# Patient Record
Sex: Female | Born: 1950 | Race: White | Hispanic: No | Marital: Married | Smoking: Former smoker
Health system: Southern US, Community
[De-identification: ages and names within clinical notes are randomized; demographics above are authoritative.]

## PROBLEM LIST (undated history)

## (undated) DIAGNOSIS — K219 Gastro-esophageal reflux disease without esophagitis: Secondary | ICD-10-CM

## (undated) DIAGNOSIS — M1990 Unspecified osteoarthritis, unspecified site: Secondary | ICD-10-CM

## (undated) HISTORY — PX: TONSILLECTOMY: SUR1361

---

## 2019-04-05 ENCOUNTER — Ambulatory Visit: Payer: Self-pay

## 2019-04-13 ENCOUNTER — Ambulatory Visit: Payer: Medicare Other | Attending: Internal Medicine

## 2019-04-13 DIAGNOSIS — Z23 Encounter for immunization: Secondary | ICD-10-CM | POA: Insufficient documentation

## 2019-04-13 NOTE — Progress Notes (Signed)
   Covid-19 Vaccination Clinic  Name:  Veronica Hodges    MRN: [id number] DOB: 19-Feb-1951  04/13/2019  [name] was observed post Covid-19 immunization for 15 minutes without incidence. She was provided with Vaccine Information Sheet and instruction to access the V-Safe system.   [name] was instructed to call 911 with any severe reactions post vaccine: Marland Kitchen Difficulty breathing  . Swelling of your face and throat  . A fast heartbeat  . A bad rash all over your body  . Dizziness and weakness    Immunizations Administered    Name Date Dose VIS Date Route   Pfizer COVID-19 Vaccine 04/13/2019  2:52 PM 0.3 mL 02/15/2019 Intramuscular   Manufacturer: [employer], [employer]   Lot: [id number]   NDC: [address]-9

## 2019-04-26 ENCOUNTER — Ambulatory Visit: Payer: Self-pay

## 2019-05-08 ENCOUNTER — Ambulatory Visit: Payer: Medicare Other | Attending: Internal Medicine

## 2019-05-08 DIAGNOSIS — Z23 Encounter for immunization: Secondary | ICD-10-CM

## 2019-05-08 NOTE — Progress Notes (Signed)
   Covid-19 Vaccination Clinic  Name:  Veronica Hodges    MRN: [id number] DOB: [birth year]  05/08/2019  [name] was observed post Covid-19 immunization for 15 minutes without incident. She was provided with Vaccine Information Sheet and instruction to access the V-Safe system.   [name] was instructed to call 911 with any severe reactions post vaccine: Marland Kitchen Difficulty breathing  . Swelling of face and throat  . A fast heartbeat  . A bad rash all over body  . Dizziness and weakness   Immunizations Administered    Name Date Dose VIS Date Route   Pfizer COVID-19 Vaccine 05/08/2019 11:07 AM 0.3 mL 02/15/2019 Intramuscular   Manufacturer: [employer], [employer]   Lot: [id number]   NDC: [address]-5

## 2019-12-21 ENCOUNTER — Other Ambulatory Visit: Payer: Self-pay

## 2019-12-21 ENCOUNTER — Ambulatory Visit: Payer: Medicare Other | Attending: Internal Medicine

## 2019-12-21 DIAGNOSIS — Z23 Encounter for immunization: Secondary | ICD-10-CM

## 2019-12-21 NOTE — Progress Notes (Signed)
   Covid-19 Vaccination Clinic  Name:  Veronica Hodges    MRN: [id number] DOB: 05-Sep-1950  12/21/2019  Veronica Hodges was observed post Covid-19 immunization for 15 minutes without incident. She was provided with Vaccine Information Sheet and instruction to access the V-Safe system.   Veronica Hodges was instructed to call 911 with any severe reactions post vaccine: Marland Kitchen Difficulty breathing  . Swelling of face and throat  . A fast heartbeat  . A bad rash all over body  . Dizziness and weakness

## 2020-03-03 ENCOUNTER — Other Ambulatory Visit: Payer: Self-pay

## 2020-03-03 ENCOUNTER — Emergency Department (HOSPITAL_COMMUNITY)
Admission: EM | Admit: 2020-03-03 | Discharge: 2020-03-04 | Disposition: A | Discharge status: Home / Self Care | Payer: Medicare Other | Source: Home / Self Care

## 2020-03-03 DIAGNOSIS — R002 Palpitations: Secondary | ICD-10-CM | POA: Insufficient documentation

## 2020-03-03 DIAGNOSIS — Z5321 Procedure and treatment not carried out due to patient leaving prior to being seen by health care provider: Secondary | ICD-10-CM | POA: Insufficient documentation

## 2020-03-03 DIAGNOSIS — R079 Chest pain, unspecified: Secondary | ICD-10-CM | POA: Diagnosis present

## 2020-03-03 DIAGNOSIS — I214 Non-ST elevation (NSTEMI) myocardial infarction: Secondary | ICD-10-CM | POA: Diagnosis not present

## 2020-03-03 DIAGNOSIS — I5181 Takotsubo syndrome: Secondary | ICD-10-CM | POA: Diagnosis not present

## 2020-03-03 DIAGNOSIS — Z23 Encounter for immunization: Secondary | ICD-10-CM | POA: Diagnosis not present

## 2020-03-03 DIAGNOSIS — I5042 Chronic combined systolic (congestive) and diastolic (congestive) heart failure: Secondary | ICD-10-CM | POA: Diagnosis not present

## 2020-03-03 DIAGNOSIS — Z20822 Contact with and (suspected) exposure to covid-19: Secondary | ICD-10-CM | POA: Diagnosis not present

## 2020-03-03 LAB — BASIC METABOLIC PANEL
Anion gap: 13 (ref 5–15)
BUN: 13 mg/dL (ref 8–23)
CO2: 24 mmol/L (ref 22–32)
Calcium: 9.8 mg/dL (ref 8.9–10.3)
Chloride: 101 mmol/L (ref 98–111)
Creatinine, Ser: 0.81 mg/dL (ref 0.44–1.00)
GFR, Estimated: >60 mL/min (ref >60)
Glucose, Bld: 133 mg/dL — ABNORMAL HIGH (ref 70–99)
Potassium: 4 mmol/L (ref 3.5–5.1)
Sodium: 138 mmol/L (ref 135–145)

## 2020-03-03 LAB — CBC
HCT: 38.8 % (ref 36.0–46.0)
Hemoglobin: 13.5 g/dL (ref 12.0–15.0)
MCH: 33.2 pg (ref 26.0–34.0)
MCHC: 34.8 g/dL (ref 30.0–36.0)
MCV: 95.3 fL (ref 80.0–100.0)
Platelets: 334 10*3/uL (ref 150–400)
RBC: 4.07 MIL/uL (ref 3.87–5.11)
RDW: 12 % (ref 11.5–15.5)
WBC: 9.6 10*3/uL (ref 4.0–10.5)
nRBC: 0 % (ref 0.0–0.2)

## 2020-03-03 LAB — TROPONIN I (HIGH SENSITIVITY)
Troponin I (High Sensitivity): 11 ng/L (ref <18)
Troponin I (High Sensitivity): 34 ng/L — ABNORMAL HIGH (ref <18)

## 2020-03-03 NOTE — ED Triage Notes (Signed)
Pt reports going for a walk this morning and after she got back inside, developed palpitations and chest pain radiating to both arms. Also sts that her hands were blue. Pain has diminished by arrival to ER.

## 2020-03-04 ENCOUNTER — Emergency Department (HOSPITAL_COMMUNITY): Payer: Medicare Other

## 2020-03-04 ENCOUNTER — Encounter (HOSPITAL_COMMUNITY): Payer: Self-pay | Admitting: Cardiology

## 2020-03-04 ENCOUNTER — Observation Stay (HOSPITAL_COMMUNITY)
Admission: EM | Admit: 2020-03-04 | Discharge: 2020-03-05 | Disposition: A | Discharge status: Home / Self Care | Payer: Medicare Other | Attending: Cardiology | Admitting: Cardiology

## 2020-03-04 DIAGNOSIS — I2510 Atherosclerotic heart disease of native coronary artery without angina pectoris: Secondary | ICD-10-CM

## 2020-03-04 DIAGNOSIS — I214 Non-ST elevation (NSTEMI) myocardial infarction: Secondary | ICD-10-CM

## 2020-03-04 DIAGNOSIS — R030 Elevated blood-pressure reading, without diagnosis of hypertension: Secondary | ICD-10-CM | POA: Diagnosis not present

## 2020-03-04 DIAGNOSIS — I5042 Chronic combined systolic (congestive) and diastolic (congestive) heart failure: Secondary | ICD-10-CM | POA: Insufficient documentation

## 2020-03-04 DIAGNOSIS — I200 Unstable angina: Secondary | ICD-10-CM

## 2020-03-04 DIAGNOSIS — E785 Hyperlipidemia, unspecified: Secondary | ICD-10-CM

## 2020-03-04 DIAGNOSIS — R079 Chest pain, unspecified: Secondary | ICD-10-CM

## 2020-03-04 DIAGNOSIS — Z20822 Contact with and (suspected) exposure to covid-19: Secondary | ICD-10-CM | POA: Insufficient documentation

## 2020-03-04 DIAGNOSIS — I5181 Takotsubo syndrome: Principal | ICD-10-CM | POA: Insufficient documentation

## 2020-03-04 DIAGNOSIS — I10 Essential (primary) hypertension: Secondary | ICD-10-CM

## 2020-03-04 DIAGNOSIS — Z23 Encounter for immunization: Secondary | ICD-10-CM | POA: Insufficient documentation

## 2020-03-04 HISTORY — DX: Gastro-esophageal reflux disease without esophagitis: K21.9

## 2020-03-04 HISTORY — DX: Unspecified osteoarthritis, unspecified site: M19.90

## 2020-03-04 LAB — COMPREHENSIVE METABOLIC PANEL
ALT: 20 U/L (ref 0–44)
AST: 24 U/L (ref 15–41)
Albumin: 3.8 g/dL (ref 3.5–5.0)
Alkaline Phosphatase: 42 U/L (ref 38–126)
Anion gap: 12 (ref 5–15)
BUN: 12 mg/dL (ref 8–23)
CO2: 22 mmol/L (ref 22–32)
Calcium: 9.3 mg/dL (ref 8.9–10.3)
Chloride: 103 mmol/L (ref 98–111)
Creatinine, Ser: 0.7 mg/dL (ref 0.44–1.00)
GFR, Estimated: >60 mL/min (ref >60)
Glucose, Bld: 97 mg/dL (ref 70–99)
Potassium: 3.7 mmol/L (ref 3.5–5.1)
Sodium: 137 mmol/L (ref 135–145)
Total Bilirubin: 1 mg/dL (ref 0.3–1.2)
Total Protein: 6.6 g/dL (ref 6.5–8.1)

## 2020-03-04 LAB — CBC WITH DIFFERENTIAL/PLATELET
Abs Immature Granulocytes: 0.03 10*3/uL (ref 0.00–0.07)
Basophils Absolute: 0 10*3/uL (ref 0.0–0.1)
Basophils Relative: 0 %
Eosinophils Absolute: 0.1 10*3/uL (ref 0.0–0.5)
Eosinophils Relative: 1 %
HCT: 36.8 % (ref 36.0–46.0)
Hemoglobin: 12 g/dL (ref 12.0–15.0)
Immature Granulocytes: 0 %
Lymphocytes Relative: 21 %
Lymphs Abs: 1.5 10*3/uL (ref 0.7–4.0)
MCH: 32.3 pg (ref 26.0–34.0)
MCHC: 32.6 g/dL (ref 30.0–36.0)
MCV: 98.9 fL (ref 80.0–100.0)
Monocytes Absolute: 0.5 10*3/uL (ref 0.1–1.0)
Monocytes Relative: 6 %
Neutro Abs: 5.3 10*3/uL (ref 1.7–7.7)
Neutrophils Relative %: 72 %
Platelets: 286 10*3/uL (ref 150–400)
RBC: 3.72 MIL/uL — ABNORMAL LOW (ref 3.87–5.11)
RDW: 12.3 % (ref 11.5–15.5)
WBC: 7.4 10*3/uL (ref 4.0–10.5)
nRBC: 0 % (ref 0.0–0.2)

## 2020-03-04 LAB — RESP PANEL BY RT-PCR (FLU A&B, COVID) ARPGX2
Influenza A by PCR: NEGATIVE
Influenza B by PCR: NEGATIVE
SARS Coronavirus 2 by RT PCR: NEGATIVE

## 2020-03-04 LAB — TROPONIN I (HIGH SENSITIVITY)
Troponin I (High Sensitivity): 34 ng/L — ABNORMAL HIGH (ref <18)
Troponin I (High Sensitivity): 24 ng/L — ABNORMAL HIGH (ref <18)
Troponin I (High Sensitivity): 21 ng/L — ABNORMAL HIGH (ref <18)

## 2020-03-04 MED ORDER — SODIUM CHLORIDE 0.9 % WEIGHT BASED INFUSION
3.0000 mL/kg/h | INTRAVENOUS | Status: DC
  Administered 2020-03-05: 04:00:00 3 mL/kg/h via INTRAVENOUS

## 2020-03-04 MED ORDER — ASPIRIN 81 MG PO CHEW
81.0000 mg | CHEWABLE_TABLET | ORAL | Status: AC
  Administered 2020-03-05: 04:00:00 81 mg via ORAL
  Filled 2020-03-04: qty 1

## 2020-03-04 MED ORDER — SODIUM CHLORIDE 0.9 % IV SOLN: 250.0000 mL | INTRAVENOUS | Status: DC | PRN

## 2020-03-04 MED ORDER — ONDANSETRON HCL 4 MG/2ML IJ SOLN: 4.0000 mg | Freq: Four times a day (QID) | INTRAMUSCULAR | Status: DC | PRN

## 2020-03-04 MED ORDER — HEPARIN (PORCINE) 25000 UT/250ML-% IV SOLN
800.0000 [IU]/h | INTRAVENOUS | Status: DC
  Administered 2020-03-04: 19:00:00 800 [IU]/h via INTRAVENOUS
  Filled 2020-03-04: qty 250

## 2020-03-04 MED ORDER — SODIUM CHLORIDE 0.9% FLUSH
3.0000 mL | Freq: Two times a day (BID) | INTRAVENOUS | Status: DC
  Administered 2020-03-04: 23:00:00 3 mL via INTRAVENOUS

## 2020-03-04 MED ORDER — SODIUM CHLORIDE 0.9% FLUSH: 3.0000 mL | INTRAVENOUS | Status: DC | PRN

## 2020-03-04 MED ORDER — ACETAMINOPHEN 325 MG PO TABS
650.0000 mg | ORAL_TABLET | ORAL | Status: DC | PRN
  Administered 2020-03-04 – 2020-03-05 (×2): 650 mg via ORAL
  Filled 2020-03-04 (×2): qty 2

## 2020-03-04 MED ORDER — SODIUM CHLORIDE 0.9 % WEIGHT BASED INFUSION: 1.0000 mL/kg/h | INTRAVENOUS | Status: DC

## 2020-03-04 MED ORDER — HEPARIN BOLUS VIA INFUSION
4000.0000 [IU] | Freq: Once | INTRAVENOUS | Status: AC
  Administered 2020-03-04: 19:00:00 4000 [IU] via INTRAVENOUS
  Filled 2020-03-04: qty 4000

## 2020-03-04 MED ORDER — ASPIRIN EC 81 MG PO TBEC: 81.0000 mg | DELAYED_RELEASE_TABLET | Freq: Every day | ORAL | Status: DC

## 2020-03-04 MED ORDER — ATORVASTATIN CALCIUM 80 MG PO TABS
80.0000 mg | ORAL_TABLET | Freq: Every day | ORAL | Status: DC
  Administered 2020-03-04: 80 mg via ORAL
  Filled 2020-03-04: qty 1

## 2020-03-04 MED ORDER — NITROGLYCERIN 0.4 MG SL SUBL: 0.4000 mg | SUBLINGUAL_TABLET | SUBLINGUAL | Status: DC | PRN

## 2020-03-04 MED ORDER — METOPROLOL TARTRATE 25 MG PO TABS
25.0000 mg | ORAL_TABLET | Freq: Two times a day (BID) | ORAL | Status: DC
  Administered 2020-03-04: 23:00:00 25 mg via ORAL
  Filled 2020-03-04: qty 1

## 2020-03-04 NOTE — ED Notes (Signed)
Pt did not want to wait and left th waiting room call family to come get her.

## 2020-03-04 NOTE — Progress Notes (Signed)
ANTICOAGULATION CONSULT NOTE  Pharmacy Consult for Heparin Indication: chest pain/ACS  No Known Allergies  Patient Measurements: Height: 5\' 5"  (165.1 cm) Weight: 66 kg (145 lb 8.1 oz) IBW/kg (Calculated) : 57 Heparin Dosing Weight: 66 kg  Vital Signs: Temp: 98.6 F (37 C) (12/29 1547) Temp Source: Oral (12/29 1547) BP: 150/64 (12/29 1705) Pulse Rate: 74 (12/29 1705)  Labs: Recent Labs    03/03/20 1627 03/03/20 2202 03/04/20 0216 03/04/20 1538  HGB 13.5  --   --  12.0  HCT 38.8  --   --  36.8  PLT 334  --   --  286  CREATININE 0.81  --   --   --   TROPONINIHS 11 34* 34*  --     Estimated Creatinine Clearance: 58.2 mL/min (by C-G formula based on SCr of 0.81 mg/dL).   Medical History: Past Medical History:  Diagnosis Date  . GERD (gastroesophageal reflux disease)     Medications:  Scheduled:  . heparin  4,000 Units Intravenous Once    Assessment: Patient is a 44 yof that presented to the ed with CP and was found to have slightly elevated trop levels yesterday. Patient had persistent cp today. Patient has no significant medical hx and cards plans for cath tomorrow. Pharmacy has been asked to dose heparin at this time.  Goal of Therapy:  Heparin level 0.3-0.7 units/ml Monitor platelets by anticoagulation protocol: Yes   Plan:  - Heparin bolus 4000 units IV x 1 dose - Heparin drip @ 800 units/hr - Heparin level in ~ 6 hours  - Monitor patient for s/s of bleeding and CBC while on heparin   66 PharmD. BCPS  03/04/2020,5:20 PM

## 2020-03-04 NOTE — H&P (Signed)
Cardiology Admission History and Physical:   Patient ID: Veronica Hodges MRN: [id number]; DOB: June 24, 1950   Admission date: 03/04/2020  Primary Care Provider: Oneita Hurt No [facility] Cardiologist: Jodelle Red, MD  [facility] Electrophysiologist:  None   Chief Complaint:  Chest pain  Patient Profile:   Veronica Hodges is a 69 y.o. female with no known PMH who presented to the ED with chest pain.    History of Present Illness:   Veronica Hodges is a 69 yo female with no known PMH. Reports she and her husband relocated here recently from [place]. She has not established with a PCP yet. Says she has never been dx with any significant medical problems in the past. No reported hx of coronary disease, but brother recently had a syncopal episode and wearing a cardiac monitor.   Says she is generally very active. [place] are lots of walking trails which they do on a regular basis. She has never had any chest pain with this activity.   Yesterday reports she walked with husband and generally felt ok. Came back to the house around 2pm and took a shower. Developed centralized chest pain/pressure with radiation into bilateral arms. Also noticed her fingers turned blue on her left hand. Presented to the ED with symptoms and had labs drawn that showed stable electrolytes, hsTn 11>>34>>34, WBC 9.6, Hgb 13.5. CXR with no acute edema, but 63mm right upper lobe nodule. She waited in the waiting room for 13hrs and eventually left. When she got home she set up her MyChart account and saw her labs. Called her insurance company and talked with NP who instructed she come back to the ED for evaluation. Called EMS.   In talking with the patient she reports having an episode of jaw tightness while in the waiting room. She thought this was related to being tense while waiting. Currently no chest pain at time of assessment.    Past Medical History:  Diagnosis Date  .  GERD (gastroesophageal reflux disease)     Medications Prior to Admission: Prior to Admission medications   Not on File     Allergies:   No Known Allergies  Social History:   Social History   Socioeconomic History  . Marital status: Married    Spouse name: Not on file  . Number of children: Not on file  . Years of education: Not on file  . Highest education level: Not on file  Occupational History  . Not on file  Tobacco Use  . Smoking status: Not on file  . Smokeless tobacco: Not on file  Substance and Sexual Activity  . Alcohol use: Not on file  . Drug use: Not on file  . Sexual activity: Not on file  Other Topics Concern  . Not on file  Social History Narrative  . Not on file   Social Determinants of Health   Financial Resource Strain: Not on file  Food Insecurity: Not on file  Transportation Needs: Not on file  Physical Activity: Not on file  Stress: Not on file  Social Connections: Not on file  Intimate Partner Violence: Not on file    Family History:   The patient's family history includes Hypertension in her father.    ROS:  Please see the history of present illness.  All other ROS reviewed and negative.     Physical Exam/Data:   Vitals:   03/04/20 1547 03/04/20 1558 03/04/20 1612 03/04/20 1705  BP:   Marland Kitchen)  141/55 (!) 150/64  Pulse:   71 74  Resp:   18 18  Temp: 98.6 F (37 C)     TempSrc: Oral     SpO2:   100% 98%  Weight:  66 kg    Height:  5\' 5"  (1.651 m)     No intake or output data in the 24 hours ending 03/04/20 1709 Last 3 Weights 03/04/2020 03/03/2020  Weight (lbs) 145 lb 8.1 oz 147 lb  Weight (kg) 66 kg 66.679 kg     Body mass index is 24.21 kg/m.  General:  Well nourished, well developed, in no acute distress HEENT: normal Lymph: no adenopathy Neck: no JVD Endocrine:  No thryomegaly Vascular: No carotid bruits; FA pulses 2+ bilaterally without bruits  Cardiac:  normal S1, S2; RRR; no murmur  Lungs:  clear to auscultation  bilaterally, no wheezing, rhonchi or rales  Abd: soft, nontender, no hepatomegaly  Ext: no edema Musculoskeletal:  No deformities, BUE and BLE strength normal and equal Skin: warm and dry  Neuro:  CNs 2-12 intact, no focal abnormalities noted Psych:  Normal affect    EKG:  The ECG that was done 12/29 was personally reviewed and demonstrates SR 73bpm, RBBB, nonspecific changes  Relevant CV Studies:  N/a  Laboratory Data:  High Sensitivity Troponin:   Recent Labs  Lab 03/03/20 1627 03/03/20 2202 03/04/20 0216  TROPONINIHS 11 34* 34*      Chemistry Recent Labs  Lab 03/03/20 1627  NA 138  K 4.0  CL 101  CO2 24  GLUCOSE 133*  BUN 13  CREATININE 0.81  CALCIUM 9.8  GFRNONAA >60  ANIONGAP 13    No results for input(s): PROT, ALBUMIN, AST, ALT, ALKPHOS, BILITOT in the last 168 hours. Hematology Recent Labs  Lab 03/03/20 1627 03/04/20 1538  WBC 9.6 7.4  RBC 4.07 3.72*  HGB 13.5 12.0  HCT 38.8 36.8  MCV 95.3 98.9  MCH 33.2 32.3  MCHC 34.8 32.6  RDW 12.0 12.3  PLT 334 286   BNPNo results for input(s): BNP, PROBNP in the last 168 hours.  DDimer No results for input(s): DDIMER in the last 168 hours.   Radiology/Studies:  DG Chest 1 View  Result Date: 03/04/2020 CLINICAL DATA:  Intermittent chest pain EXAM: CHEST  1 VIEW COMPARISON:  None. FINDINGS: No acute consolidation or effusion. Normal cardiac size. No pneumothorax. 17 mm irregular right upper lobe lung nodule. Summation artifact versus tiny nodule left lung apex. IMPRESSION: 1. No acute infiltrate or edema. 2. 17 mm irregular right upper lobe lung nodule. Tiny nodule versus summation artifact at left apex. CT chest recommended for further evaluation. Electronically Signed   By: 03/06/2020 M.D.   On: 03/04/2020 15:31     Assessment and Plan:   Veronica Hodges is a 69 y.o. female with no known PMH who presented to the ED with chest pain.    1. NSTEMI: presented with chest pain after walking with  her husband. hsTn 11>>34>>34. No acute ischemia noted on EKG. Her symptoms are concerning for coronary disease. Will need further work up with ischemic evaluation. Seems reasonable to proceed with cardiac catheterization tomorrow. hsTn pending -- admit  -- start IV heparin, ASA, statin, and BB -- check echo -- The patient understands that risks included but are not limited to stroke (1 in 1000), death (1 in 1000), kidney failure [usually temporary] (1 in 500), bleeding (1 in 200), allergic reaction [possibly serious] (1 in 200). -- check  lipids and Hgb A1c  2. HTN: blood pressures are elevated while in the ED. Will add metoprolol 25mg  BID  HEAR Score (for undifferentiated chest pain):  HEAR Score: 4   Severity of Illness: The appropriate patient status for this patient is OBSERVATION. Observation status is judged to be reasonable and necessary in order to provide the required intensity of service to ensure the patient's safety. The patient's presenting symptoms, physical exam findings, and initial radiographic and laboratory data in the context of their medical condition is felt to place them at decreased risk for further clinical deterioration. Furthermore, it is anticipated that the patient will be medically stable for discharge from the hospital within 2 midnights of admission. The following factors support the patient status of observation.   " The patient's presenting symptoms include chest pain. " The physical exam findings include stable exam. " The initial radiographic and laboratory data are elevated troponin.  For questions or updates, please contact [facility] Please consult [email or web address] for contact info under     Signed, , NP  03/04/2020 5:09 PM

## 2020-03-04 NOTE — ED Triage Notes (Addendum)
Pt BIB [facility] w/ complaints of chest pain that started at 2 p.m. yesterday. Pt was seen in triage yesterday but due to 13 hour wait patient left. When pt was home she continued to have intermittent chest pain centralized that radiated to bilateral jaw. Pt stated pain is a pressure. Pt noted that she was able to see after d/c that troponins were elevated, contacted her NP who called 911 on her behalf to be seen in the ER. Pt endorsed initial nausea, but no emesis. Pt denies SoB, diarrhea, and dizziness. VSS w/ EMS.

## 2020-03-04 NOTE — ED Provider Notes (Signed)
[facility]   CSN: [id number] Arrival date & time: 03/04/20  1453     History No chief complaint on file.   Veronica Hodges is a 69 y.o. female.  HPI 69 year old female with no significant medical history presents to the ER with 2 days of chest pain.  States that her pain started central at around 2 PM yesterday.  Describes it as squeezing.  States that pain did travel to her left arm, and states that her left hand went blue for several minutes.  This however resolved.  The pain has intermittently also traveled down her right arm and also into her neck.  Denies any nausea or vomiting.  No diaphoresis.  Pain has waxed and waned.  She arrived at the ER yesterday, had some basic labs drawn, waited for 13 hours in the month home.  She initially did not have access to MyChart, however when she was able to login, she saw that her troponins were elevated.  She did not know what this meant, and called a nurse practitioner that was associated with her health insurance.  She was told to come back to the ER.  She states that she went to bed yesterday with very mild pain.  However throughout the day, the pain will fluctuate to as high as a 6 or 7 out of 10.  Sometimes will have some jaw pain.  Denies any dizziness, diarrhea, no back pain, no syncope.  Does endorse mild fluctuating nausea.  She was given some nitroglycerin here in the ER which did significantly improve her symptoms.  Currently her pain is 2/10.  He was also given some aspirin by her family.  She does not have a known cardiac history and is not on any medications, but however states that she has not seen a doctor in "many years".  She stopped smoking 25 years ago, no known personal history of cardiac disease, diabetes, high cholesterol, does state that her brother had cardiac disease 70 years old. HPI: A 69 year old patient presents for evaluation of chest pain. Initial onset of pain was  more than 6 hours ago. The patient's chest pain is described as heaviness/pressure/tightness and is not worse with exertion. The patient's chest pain is middle- or left-sided, is not well-localized, is not sharp and does not radiate to the arms/jaw/neck. The patient does not complain of nausea and denies diaphoresis. The patient has no history of stroke, has no history of peripheral artery disease, has not smoked in the past 90 days, denies any history of treated diabetes, has no relevant family history of coronary artery disease (first degree relative at less than age 70), is not hypertensive, has no history of hypercholesterolemia and does not have an elevated BMI (>=30).   Past Medical History:  Diagnosis Date  . GERD (gastroesophageal reflux disease)     Patient Active Problem List   Diagnosis Date Noted  . Non-STEMI (non-ST elevated myocardial infarction) (HCC) 03/04/2020    The histories are not reviewed yet. Please review them in the "History" navigator section and refresh this SmartLink.   OB History   No obstetric history on file.     Family History  Problem Relation Age of Onset  . Hypertension Father        Home Medications Prior to Admission medications   Not on File    Allergies    Patient has no known allergies.  Review of Systems   Review of Systems  Constitutional:  Negative for chills and fever.  HENT: Negative for ear pain and sore throat.   Eyes: Negative for pain and visual disturbance.  Respiratory: Negative for cough and shortness of breath.   Cardiovascular: Positive for chest pain and palpitations.  Gastrointestinal: Positive for nausea. Negative for abdominal pain and vomiting.  Genitourinary: Negative for dysuria and hematuria.  Musculoskeletal: Negative for arthralgias and back pain.  Skin: Negative for color change and rash.  Neurological: Negative for seizures and syncope.  All other systems reviewed and are negative.   Physical  Exam Updated Vital Signs BP (!) 150/64 (BP Location: Right Arm)   Pulse 74   Temp 98.6 F (37 C) (Oral)   Resp 18   Ht 5\' 5"  (1.651 m)   Wt 66 kg   SpO2 98%   BMI 24.21 kg/m   Physical Exam Vitals and nursing note reviewed.  Constitutional:      General: She is not in acute distress.    Appearance: She is well-developed and well-nourished. She is not ill-appearing, toxic-appearing or diaphoretic.  HENT:     Head: Normocephalic and atraumatic.  Eyes:     Conjunctiva/sclera: Conjunctivae normal.  Cardiovascular:     Rate and Rhythm: Normal rate and regular rhythm.     Pulses: Normal pulses.     Heart sounds: Normal heart sounds. No murmur heard.   Pulmonary:     Effort: Pulmonary effort is normal. No respiratory distress.     Breath sounds: Normal breath sounds.  Abdominal:     General: Abdomen is flat.     Palpations: Abdomen is soft.     Tenderness: There is no abdominal tenderness.  Musculoskeletal:        General: No edema.     Cervical back: Neck supple.     Right lower leg: No edema.     Left lower leg: No edema.  Skin:    General: Skin is warm and dry.     Findings: No erythema.  Neurological:     General: No focal deficit present.     Mental Status: She is alert and oriented to person, place, and time.  Psychiatric:        Mood and Affect: Mood and affect and mood normal.        Behavior: Behavior normal.     ED Results / Procedures / Treatments   Labs (all labs ordered are listed, but only abnormal results are displayed) Labs Reviewed  CBC WITH DIFFERENTIAL/PLATELET - Abnormal; Notable for the following components:      Result Value   RBC 3.72 (*)    All other components within normal limits  RESP PANEL BY RT-PCR (FLU A&B, COVID) ARPGX2  SARS CORONAVIRUS 2 (TAT 6-24 HRS)  COMPREHENSIVE METABOLIC PANEL  HEPARIN LEVEL (UNFRACTIONATED)  CBC  HEMOGLOBIN A1C  BASIC METABOLIC PANEL  LIPID PANEL  TROPONIN I (HIGH SENSITIVITY)  TROPONIN I (HIGH  SENSITIVITY)    EKG None  Radiology DG Chest 1 View  Result Date: 03/04/2020 CLINICAL DATA:  Intermittent chest pain EXAM: CHEST  1 VIEW COMPARISON:  None. FINDINGS: No acute consolidation or effusion. Normal cardiac size. No pneumothorax. 17 mm irregular right upper lobe lung nodule. Summation artifact versus tiny nodule left lung apex. IMPRESSION: 1. No acute infiltrate or edema. 2. 17 mm irregular right upper lobe lung nodule. Tiny nodule versus summation artifact at left apex. CT chest recommended for further evaluation. Electronically Signed   By: 03/06/2020 M.D.   On: 03/04/2020  15:31    Procedures .Critical Care Performed by: Mare Ferrari, PA-C Authorized by: Mare Ferrari, PA-C   Critical care provider statement:    Critical care time (minutes):  30   Critical care was necessary to treat or prevent imminent or life-threatening deterioration of the following conditions:  Cardiac failure   Critical care was time spent personally by me on the following activities:  Discussions with consultants, evaluation of patient's response to treatment, examination of patient, ordering and performing treatments and interventions, ordering and review of laboratory studies, ordering and review of radiographic studies, pulse oximetry, re-evaluation of patient's condition, obtaining history from patient or surrogate and review of old charts   (including critical care time)  Medications Ordered in ED Medications  aspirin EC tablet 81 mg (has no administration in time range)  nitroGLYCERIN (NITROSTAT) SL tablet 0.4 mg (has no administration in time range)  acetaminophen (TYLENOL) tablet 650 mg (650 mg Oral Given 03/04/20 1801)  ondansetron (ZOFRAN) injection 4 mg (has no administration in time range)  metoprolol tartrate (LOPRESSOR) tablet 25 mg (has no administration in time range)  atorvastatin (LIPITOR) tablet 80 mg (has no administration in time range)  sodium chloride flush (NS) 0.9 %  injection 3 mL (has no administration in time range)  sodium chloride flush (NS) 0.9 % injection 3 mL (has no administration in time range)  0.9 %  sodium chloride infusion (has no administration in time range)  aspirin chewable tablet 81 mg (has no administration in time range)  0.9% sodium chloride infusion (has no administration in time range)    Followed by  0.9% sodium chloride infusion (has no administration in time range)  heparin bolus via infusion 4,000 Units (has no administration in time range)  heparin ADULT infusion 100 units/mL (25000 units/288mL) (has no administration in time range)    ED Course  I have reviewed the triage vital signs and the nursing notes.  Pertinent labs & imaging results that were available during my care of the patient were reviewed by me and considered in my medical decision making (see chart for details).  Clinical Course as of 03/04/20 [place]  Wed Mar 04, 2020  3710 69 year old female here with chest pain, jaw pain, shoulder pain beginning yesterday, intermittent for past 2 days, currently 2/10 intensity, never had this before.  Reports subjective fevers this week possibly.  Denies SOB.  Did receive covid x 2 vaccines and a booster.  No prior cardiac hx, hasn't seen any doctor in many years, unaware of any hx of HLD, HTN, Diabetes.  Quit smoking 25 years ago.  Family hx sig only for brother w/ cardiac disease over age 25. Yesterday she came to the ED and left after long waiting times without being seen, had experienced relief with nitro given by EMS prior to arrival.  Trop 11 -> 34 on repeat yest.  Now with stable vitals, pending labs and ecg [MT]  1604 Cards consulted [MT]  1604 Ecg per my interpretation with RBBB, no STEMI. [MT]    Clinical Course User Index [MT] Trifan, Kermit Balo, MD   MDM Rules/Calculators/A&P HEAR Score: 3                        69 year old female with complaints of 2 days of chest pain Vitals on arrival overall reassuring.   Physical exam unremarkable.  Overall well-appearing.  I reviewed her lab work from when she arrived yesterday.  Troponin went  from 11-34, repeat troponin today at 2 PM remained stable at 34.  EKG reviewed by myself and my supervising physician, consistent with right bundle branch block, no STEMI.  Lab work is pending, given concerning story for NSTEMI, however cardiology was consulted.  CBC is largely unremarkable.  Covid is negative.  Chest x-ray with incidental right pulmonary nodule.  She will need further evaluation for this.  Cardiology saw and evaluated the patient, plan for admission and cath tomorrow.  Started on heparin drip.  Remains hemodynamically stable here in the ED.  This was a shared visit with my supervising physician Dr. Renaye Rakers who independently saw and evaluated the patient & provided guidance in evaluation/management/disposition ,in agreement with care   Final Clinical Impression(s) / ED Diagnoses Final diagnoses:  NSTEMI (non-ST elevated myocardial infarction) [facility])    Rx / DC Orders ED Discharge Orders    None       Leone Brand 03/04/20 1848    Terald Sleeper, MD 03/04/20 2016

## 2020-03-04 NOTE — ED Notes (Signed)
Consent signed and at bedside  

## 2020-03-04 NOTE — ED Notes (Signed)
Pt taken to xray 

## 2020-03-04 NOTE — ED Notes (Signed)
Dinner Tray Ordered @ [phone].

## 2020-03-05 ENCOUNTER — Observation Stay (HOSPITAL_BASED_OUTPATIENT_CLINIC_OR_DEPARTMENT_OTHER): Payer: Medicare Other

## 2020-03-05 ENCOUNTER — Encounter (HOSPITAL_COMMUNITY)
Admission: EM | Disposition: A | Discharge status: Home / Self Care | Payer: Self-pay | Source: Home / Self Care | Attending: Emergency Medicine

## 2020-03-05 ENCOUNTER — Encounter (HOSPITAL_COMMUNITY): Payer: Self-pay | Admitting: Cardiology

## 2020-03-05 DIAGNOSIS — R778 Other specified abnormalities of plasma proteins: Secondary | ICD-10-CM

## 2020-03-05 DIAGNOSIS — I5181 Takotsubo syndrome: Secondary | ICD-10-CM

## 2020-03-05 DIAGNOSIS — I200 Unstable angina: Secondary | ICD-10-CM

## 2020-03-05 DIAGNOSIS — R079 Chest pain, unspecified: Secondary | ICD-10-CM

## 2020-03-05 DIAGNOSIS — I214 Non-ST elevation (NSTEMI) myocardial infarction: Secondary | ICD-10-CM | POA: Diagnosis not present

## 2020-03-05 DIAGNOSIS — E785 Hyperlipidemia, unspecified: Secondary | ICD-10-CM

## 2020-03-05 DIAGNOSIS — I2510 Atherosclerotic heart disease of native coronary artery without angina pectoris: Secondary | ICD-10-CM

## 2020-03-05 DIAGNOSIS — I10 Essential (primary) hypertension: Secondary | ICD-10-CM

## 2020-03-05 DIAGNOSIS — I5042 Chronic combined systolic (congestive) and diastolic (congestive) heart failure: Secondary | ICD-10-CM

## 2020-03-05 HISTORY — PX: LEFT HEART CATH AND CORONARY ANGIOGRAPHY: CATH118249

## 2020-03-05 LAB — CBC
HCT: 33.6 % — ABNORMAL LOW (ref 36.0–46.0)
Hemoglobin: 11.8 g/dL — ABNORMAL LOW (ref 12.0–15.0)
MCH: 32.9 pg (ref 26.0–34.0)
MCHC: 35.1 g/dL (ref 30.0–36.0)
MCV: 93.6 fL (ref 80.0–100.0)
Platelets: 304 10*3/uL (ref 150–400)
RBC: 3.59 MIL/uL — ABNORMAL LOW (ref 3.87–5.11)
RDW: 12.1 % (ref 11.5–15.5)
WBC: 8.8 10*3/uL (ref 4.0–10.5)
nRBC: 0 % (ref 0.0–0.2)

## 2020-03-05 LAB — ECHOCARDIOGRAM COMPLETE
Area-P 1/2: 2.32 cm2
Calc EF: 30.3 %
Height: 65 in
S' Lateral: 4 cm
Single Plane A2C EF: 36.1 %
Single Plane A4C EF: 32.2 %
Weight: 2240 oz

## 2020-03-05 LAB — BASIC METABOLIC PANEL
Anion gap: 8 (ref 5–15)
BUN: 12 mg/dL (ref 8–23)
CO2: 24 mmol/L (ref 22–32)
Calcium: 9.1 mg/dL (ref 8.9–10.3)
Chloride: 104 mmol/L (ref 98–111)
Creatinine, Ser: 0.82 mg/dL (ref 0.44–1.00)
GFR, Estimated: >60 mL/min (ref >60)
Glucose, Bld: 148 mg/dL — ABNORMAL HIGH (ref 70–99)
Potassium: 3.3 mmol/L — ABNORMAL LOW (ref 3.5–5.1)
Sodium: 136 mmol/L (ref 135–145)

## 2020-03-05 LAB — HEMOGLOBIN A1C
Hgb A1c MFr Bld: 5.2 % (ref 4.8–5.6)
Mean Plasma Glucose: 102.54 mg/dL

## 2020-03-05 LAB — LIPID PANEL
Cholesterol: 189 mg/dL (ref 0–200)
HDL: 71 mg/dL (ref >40)
LDL Cholesterol: 110 mg/dL — ABNORMAL HIGH (ref 0–99)
Total CHOL/HDL Ratio: 2.7 RATIO
Triglycerides: 42 mg/dL (ref <150)
VLDL: 8 mg/dL (ref 0–40)

## 2020-03-05 LAB — HEPARIN LEVEL (UNFRACTIONATED): Heparin Unfractionated: 0.31 IU/mL (ref 0.30–0.70)

## 2020-03-05 SURGERY — LEFT HEART CATH AND CORONARY ANGIOGRAPHY
Anesthesia: LOCAL

## 2020-03-05 MED ORDER — IOHEXOL 350 MG/ML SOLN
INTRAVENOUS | Status: DC | PRN
  Administered 2020-03-05: 10:00:00 90 mL via INTRA_ARTERIAL

## 2020-03-05 MED ORDER — INFLUENZA VAC A&B SA ADJ QUAD 0.5 ML IM PRSY
0.5000 mL | PREFILLED_SYRINGE | Freq: Once | INTRAMUSCULAR | Status: AC
  Administered 2020-03-05: 18:00:00 0.5 mL via INTRAMUSCULAR
  Filled 2020-03-05: qty 0.5

## 2020-03-05 MED ORDER — LIDOCAINE HCL (PF) 1 % IJ SOLN
INTRAMUSCULAR | Status: DC | PRN
  Administered 2020-03-05: 2 mL

## 2020-03-05 MED ORDER — SODIUM CHLORIDE 0.9 % IV SOLN: INTRAVENOUS | Status: AC

## 2020-03-05 MED ORDER — HEPARIN SODIUM (PORCINE) 1000 UNIT/ML IJ SOLN
INTRAMUSCULAR | Status: DC | PRN
  Administered 2020-03-05: 2500 [IU] via INTRAVENOUS

## 2020-03-05 MED ORDER — ACETAMINOPHEN 325 MG PO TABS: 650.0000 mg | ORAL_TABLET | ORAL | Status: DC | PRN

## 2020-03-05 MED ORDER — HEPARIN SODIUM (PORCINE) 1000 UNIT/ML IJ SOLN
INTRAMUSCULAR | Status: AC
  Filled 2020-03-05: qty 1

## 2020-03-05 MED ORDER — ASPIRIN 81 MG PO CHEW: 81.0000 mg | CHEWABLE_TABLET | Freq: Every day | ORAL | Status: DC

## 2020-03-05 MED ORDER — PERFLUTREN LIPID MICROSPHERE
1.0000 mL | INTRAVENOUS | Status: AC | PRN
  Administered 2020-03-05: 11:00:00 2 mL via INTRAVENOUS
  Filled 2020-03-05: qty 10

## 2020-03-05 MED ORDER — SODIUM CHLORIDE 0.9 % IV SOLN: 250.0000 mL | INTRAVENOUS | Status: DC | PRN

## 2020-03-05 MED ORDER — ONDANSETRON HCL 4 MG/2ML IJ SOLN: 4.0000 mg | Freq: Four times a day (QID) | INTRAMUSCULAR | Status: DC | PRN

## 2020-03-05 MED ORDER — FENTANYL CITRATE (PF) 100 MCG/2ML IJ SOLN
INTRAMUSCULAR | Status: DC | PRN
  Administered 2020-03-05 (×2): 25 ug via INTRAVENOUS

## 2020-03-05 MED ORDER — PNEUMOCOCCAL VAC POLYVALENT 25 MCG/0.5ML IJ INJ
0.5000 mL | INJECTION | Freq: Once | INTRAMUSCULAR | Status: AC
  Administered 2020-03-05: 18:00:00 0.5 mL via INTRAMUSCULAR
  Filled 2020-03-05: qty 0.5

## 2020-03-05 MED ORDER — INFLUENZA VAC A&B SA ADJ QUAD 0.5 ML IM PRSY: 0.5000 mL | PREFILLED_SYRINGE | INTRAMUSCULAR | Status: DC

## 2020-03-05 MED ORDER — SODIUM CHLORIDE 0.9% FLUSH: 3.0000 mL | INTRAVENOUS | Status: DC | PRN

## 2020-03-05 MED ORDER — METOPROLOL SUCCINATE ER 50 MG PO TB24
50.0000 mg | ORAL_TABLET | Freq: Every day | ORAL | Status: DC
  Administered 2020-03-05: 18:00:00 50 mg via ORAL
  Filled 2020-03-05: qty 1

## 2020-03-05 MED ORDER — POTASSIUM CHLORIDE CRYS ER 20 MEQ PO TBCR
20.0000 meq | EXTENDED_RELEASE_TABLET | Freq: Once | ORAL | Status: AC
  Administered 2020-03-05: 09:00:00 20 meq via ORAL
  Filled 2020-03-05: qty 1

## 2020-03-05 MED ORDER — LABETALOL HCL 5 MG/ML IV SOLN: 10.0000 mg | INTRAVENOUS | Status: AC | PRN

## 2020-03-05 MED ORDER — LIDOCAINE HCL (PF) 1 % IJ SOLN
INTRAMUSCULAR | Status: AC
  Filled 2020-03-05: qty 30

## 2020-03-05 MED ORDER — VERAPAMIL HCL 2.5 MG/ML IV SOLN
INTRAVENOUS | Status: AC
  Filled 2020-03-05: qty 2

## 2020-03-05 MED ORDER — PNEUMOCOCCAL VAC POLYVALENT 25 MCG/0.5ML IJ INJ: 0.5000 mL | INJECTION | INTRAMUSCULAR | Status: DC

## 2020-03-05 MED ORDER — HEPARIN (PORCINE) IN NACL 1000-0.9 UT/500ML-% IV SOLN
INTRAVENOUS | Status: AC
  Filled 2020-03-05: qty 1000

## 2020-03-05 MED ORDER — FENTANYL CITRATE (PF) 100 MCG/2ML IJ SOLN
INTRAMUSCULAR | Status: AC
  Filled 2020-03-05: qty 2

## 2020-03-05 MED ORDER — METOPROLOL SUCCINATE ER 50 MG PO TB24
50.0000 mg | ORAL_TABLET | Freq: Every day | ORAL | 3 refills | Status: DC
Start: 2020-03-05 — End: 2020-11-02

## 2020-03-05 MED ORDER — SODIUM CHLORIDE 0.9% FLUSH: 3.0000 mL | Freq: Two times a day (BID) | INTRAVENOUS | Status: DC

## 2020-03-05 MED ORDER — HYDRALAZINE HCL 20 MG/ML IJ SOLN: 10.0000 mg | INTRAMUSCULAR | Status: AC | PRN

## 2020-03-05 MED ORDER — MIDAZOLAM HCL 2 MG/2ML IJ SOLN
INTRAMUSCULAR | Status: DC | PRN
  Administered 2020-03-05 (×2): 0.5 mg via INTRAVENOUS

## 2020-03-05 MED ORDER — VERAPAMIL HCL 2.5 MG/ML IV SOLN
INTRAVENOUS | Status: DC | PRN
  Administered 2020-03-05: 10:00:00 10 mL via INTRA_ARTERIAL

## 2020-03-05 MED ORDER — MIDAZOLAM HCL 2 MG/2ML IJ SOLN
INTRAMUSCULAR | Status: AC
  Filled 2020-03-05: qty 2

## 2020-03-05 MED ORDER — HEPARIN (PORCINE) IN NACL 1000-0.9 UT/500ML-% IV SOLN
INTRAVENOUS | Status: DC | PRN
  Administered 2020-03-05 (×3): 500 mL

## 2020-03-05 MED ORDER — HEPARIN SODIUM (PORCINE) 5000 UNIT/ML IJ SOLN: 5000.0000 [IU] | Freq: Three times a day (TID) | INTRAMUSCULAR | Status: DC

## 2020-03-05 SURGICAL SUPPLY — 11 items
CATH INFINITI 5 FR JL3.5 (CATHETERS) ×2 IMPLANT
CATH INFINITI JR4 5F (CATHETERS) ×2 IMPLANT
DEVICE RAD COMP TR BAND LRG (VASCULAR PRODUCTS) ×2 IMPLANT
GLIDESHEATH SLEND A-KIT 6F 22G (SHEATH) ×2 IMPLANT
GUIDEWIRE INQWIRE 1.5J.035X260 (WIRE) ×1 IMPLANT
INQWIRE 1.5J .035X260CM (WIRE) ×2
KIT HEART LEFT (KITS) ×2 IMPLANT
PACK CARDIAC CATHETERIZATION (CUSTOM PROCEDURE TRAY) ×2 IMPLANT
SHEATH PROBE COVER 6X72 (BAG) ×2 IMPLANT
TRANSDUCER W/STOPCOCK (MISCELLANEOUS) ×2 IMPLANT
TUBING CIL FLEX 10 FLL-RA (TUBING) ×2 IMPLANT

## 2020-03-05 NOTE — CV Procedure (Addendum)
   Right dominant coronary anatomy  Tortuous left anterior descending with mid and distal acute angulation.  No obstructive disease.  Circumflex is normal  Left main is normal  RCA is normal and tortuous.  Anteroapical apical and inferoapical hypokinesis.  Basal hyperkinesis.  LVEDP is normal.  EF is 50%.   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  MINOCA / INOCA seems unlikely if wall motion abnormality is demonstrated.  Recommendation: Conservative medical management

## 2020-03-05 NOTE — Interval H&P Note (Signed)
Cath Lab Visit (complete for each Cath Lab visit)  Clinical Evaluation Leading to the Procedure:   ACS: Yes  Non-ACS:    Anginal Classification: CCS II  Anti-ischemic medical therapy: Minimal Therapy (1 class of medications)  Non-Invasive Test Results: No non-invasive testing performed  Prior CABG: No previous CABG      History and Physical Interval Note:  03/05/2020 9:11 AM  Veronica Hodges  has presented today for surgery, with the diagnosis of nstemi.  The various methods of treatment have been discussed with the patient and family. After consideration of risks, benefits and other options for treatment, the patient has consented to  Procedure(s): LEFT HEART CATH AND CORONARY ANGIOGRAPHY (N/A) as a surgical intervention.  The patient's history has been reviewed, patient examined, no change in status, stable for surgery.  I have reviewed the patient's chart and labs.  Questions were answered to the patient's satisfaction.     Lyn Records III

## 2020-03-05 NOTE — Discharge Summary (Addendum)
Discharge Summary    Patient ID: Veronica Hodges MRN: [id number]; DOB: 09-12-1950  Admit date: 03/04/2020 Discharge date: 03/05/2020  Primary Care Provider: Pcp, No  Primary Cardiologist: Jodelle Red, MD  Primary Electrophysiologist:  None   Discharge Diagnoses    Principal Problem:   Takotsubo cardiomyopathy Active Problems:   Non-STEMI (non-ST elevated myocardial infarction) [facility])   Unstable angina pectoris (HCC)   Hypertension   Chronic combined systolic and diastolic heart failure (HCC)   Dyslipidemia   Diagnostic Studies/Procedures    Cardiac cath 03/05/20 Conclusion   Right dominant coronary anatomy  Widely patent coronary arteries.    Coronary tortuosity is noted in all 3 territories.  The LAD contains 2 acute vessel angulation/kinks in the mid and distal territory..  No obstructive disease is noted.  Poor quality left ventriculogram demonstrates anteroapical and inferoapical hypokinesis.  Anterobasal inferobasal contractility is hyperdynamic.  LVEDP is 13 mmHg.   Clinical presentation and cath data suggests probable Takotsubo syndrome.  INOCA / MINOCA seem less likely.  RECOMMENDATIONS:   Conservative management  Coated aspirin 81 mg daily    Echo 03/05/20 1. Left ventricular ejection fraction, by estimation, is 30 to 35%. The  left ventricle has moderately decreased function. The left ventricle  demonstrates global hypokinesis. Left ventricular diastolic parameters are  consistent with Grade I diastolic  dysfunction (impaired relaxation).  2. Right ventricular systolic function is normal. The right ventricular  size is normal.  3. The mitral valve is normal in structure. Mild mitral valve  regurgitation. No evidence of mitral stenosis.  4. The aortic valve is normal in structure. Aortic valve regurgitation is  not visualized. No aortic stenosis is present.  5. The inferior vena cava is normal in size with greater than 50%   respiratory variability, suggesting right atrial pressure of 3 mmHg.  _____________   History of Present Illness     Veronica Hodges is a 69 y.o. female with no known PMH who presented to the ED with chest pain. Her and her husband recently located here from [place] and has not established with PCP yet. She denied history of CAD. Patient generally very active and denied chest pain on exertion. The day before presentation she had just gotten back from a walk and developed centralized chest pain/pressure with radiation into bilateral arms. She went to the ED for further evaluation  Hospital Course     Consultants: None  In the ED HS troponin was minimally elevated 11>34>34. CXR showed no edema but a right upper lobe nodule. She waited in the ER for hours and eventually left. When she got home she looked at her labs and talked with her NP who instructed her to go back to the ED for evaluation. EMS was called. Once again in the ED the patient was started on IV heparin and admitted for suspected NSTEMI. Also started on ASA, statin, and BB. She was taken back to the cath lab which showed widely patent coronaries with no obstructive disease, LV gram showed anteroapical and inferoapical hypokinesis, LVEDP . Presentation was suspected Takotsubo syndrome rather than NSTEMI. Recommended conservative management with Aspirin 81 mg daily. Echo showed LVEF 30-35%, global HK, G1DD, mild MR. The patient remained stable. Labs came back showing LDL 110 and A1C 5.2.  Will plan to send patient home on metoprolol succinate 50mg  daily. At follow-up will consider GDMT with ACE/ARB/ARNI. Patient not agreeable to starting a statin. The patient was instructed to take her blood pressures daily as well as  daily weights and low salt diet.   The patient was evaluated by Dr. Cristal Deer 03/05/20 and felt to be stable for discharge.   Did the patient have an acute coronary syndrome (MI, NSTEMI, STEMI, etc) this  admission?:  Yes                               AHA/ACC Clinical Performance & Quality Measures: 3. Aspirin prescribed? - Yes 4. ADP Receptor Inhibitor (Plavix/Clopidogrel, Brilinta/Ticagrelor or Effient/Prasugrel) prescribed (includes medically managed patients)? - No, mod CAD and no PCI 5. Beta Blocker prescribed? - Yes 6. High Intensity Statin (Lipitor 40-80mg  or Crestor 20-40mg ) prescribed? - Yes 7. EF assessed during THIS hospitalization? - Yes 8. For EF <40%, was ACEI/ARB prescribed? - No- will address at follow-up 9. For EF <40%, Aldosterone Antagonist (Spironolactone or Eplerenone) prescribed? - No - Reason:  will address at follow-up 10. Cardiac Rehab Phase II ordered (including medically managed patients)? - Yes       _____________  Discharge Vitals Blood pressure (!) 136/57, pulse 63, temperature 98.5 F (36.9 C), temperature source Oral, resp. rate 18, height  (1.651 m), weight 63.5 kg, SpO2 97 %.  Filed Weights   03/04/20 1558 03/04/20 2300 03/05/20 0410  Weight: 66 kg 63.9 kg 63.5 kg    Labs & Radiologic Studies    CBC Recent Labs    03/04/20 1538 03/05/20 0025  WBC 7.4 8.8  NEUTROABS 5.3  --   HGB 12.0 11.8*  HCT 36.8 33.6*  MCV 98.9 93.6  PLT 286 304   Basic Metabolic Panel Recent Labs    34/74/25 1650 03/05/20 0025  NA 137 136  K 3.7 3.3*  CL 103 104  CO2 22 24  GLUCOSE 97 148*  BUN 12 12  CREATININE 0.70 0.82  CALCIUM 9.3 9.1   Liver Function Tests Recent Labs    03/04/20 1650  AST 24  ALT 20  ALKPHOS 42  BILITOT 1.0  PROT 6.6  ALBUMIN 3.8   No results for input(s): LIPASE, AMYLASE in the last 72 hours. High Sensitivity Troponin:   Recent Labs  Lab 03/03/20 1627 03/03/20 2202 03/04/20 0216 03/04/20 1650 03/04/20 2259  TROPONINIHS 11 34* 34* 24* 21*    BNP Invalid input(s): POCBNP D-Dimer No results for input(s): DDIMER in the last 72 hours. Hemoglobin A1C Recent Labs    03/05/20 0025  HGBA1C 5.2   Fasting Lipid  Panel Recent Labs    03/05/20 0025  CHOL 189  HDL 71  LDLCALC 110*  TRIG 42  CHOLHDL 2.7   Thyroid Function Tests No results for input(s): TSH, T4TOTAL, T3FREE, THYROIDAB in the last 72 hours.  Invalid input(s): FREET3 _____________  DG Chest 1 View  Result Date: 03/04/2020 CLINICAL DATA:  Intermittent chest pain EXAM: CHEST  1 VIEW COMPARISON:  None. FINDINGS: No acute consolidation or effusion. Normal cardiac size. No pneumothorax. 17 mm irregular right upper lobe lung nodule. Summation artifact versus tiny nodule left lung apex. IMPRESSION: 1. No acute infiltrate or edema. 2. 17 mm irregular right upper lobe lung nodule. Tiny nodule versus summation artifact at left apex. CT chest recommended for further evaluation. Electronically Signed   By: Jasmine Pang M.D.   On: 03/04/2020 15:31   CARDIAC CATHETERIZATION  Result Date: 03/05/2020  Right dominant coronary anatomy  Widely patent coronary arteries.   Coronary tortuosity is noted in all 3 territories.  The LAD contains 2 acute  vessel angulation/kinks in the mid and distal territory..  No obstructive disease is noted.  Poor quality left ventriculogram demonstrates anteroapical and inferoapical hypokinesis.  Anterobasal inferobasal contractility is hyperdynamic.  LVEDP is 13 mmHg.  Clinical presentation and cath data suggests probable Takotsubo syndrome.  INOCA / MINOCA seem less likely. RECOMMENDATIONS:  Conservative management  Coated aspirin 81 mg daily  ECHOCARDIOGRAM COMPLETE  Result Date: 03/05/2020    ECHOCARDIOGRAM REPORT   Patient Name:   Veronica Hodges [facility] Date of Exam: 03/05/2020 Medical Rec #:  [id number]           Height:       65.0 in Accession #:    [id number]          Weight:       140.0 lb Date of Birth:  [birth year]           BSA:          1.700 m Patient Age:    69 years            BP:           125/64 mmHg Patient Gender: F                   HR:           60 bpm. Exam Location:  Inpatient Procedure: 2D Echo,  Color Doppler, Cardiac Doppler and Intracardiac            Opacification Agent Indications:    Chest Pain R07.9  History:        Patient has no prior history of Echocardiogram examinations.                 GERD.  Sonographer:    Leta Jungling RDCS Referring Phys: [phone] LINDSAY B ROBERTS IMPRESSIONS  1. Left ventricular ejection fraction, by estimation, is 30 to 35%. The left ventricle has moderately decreased function. The left ventricle demonstrates global hypokinesis. Left ventricular diastolic parameters are consistent with Grade I diastolic dysfunction (impaired relaxation).  2. Right ventricular systolic function is normal. The right ventricular size is normal.  3. The mitral valve is normal in structure. Mild mitral valve regurgitation. No evidence of mitral stenosis.  4. The aortic valve is normal in structure. Aortic valve regurgitation is not visualized. No aortic stenosis is present.  5. The inferior vena cava is normal in size with greater than 50% respiratory variability, suggesting right atrial pressure of 3 mmHg. FINDINGS  Left Ventricle: Left ventricular ejection fraction, by estimation, is 30 to 35%. The left ventricle has moderately decreased function. The left ventricle demonstrates global hypokinesis. The left ventricular internal cavity size was normal in size. There is no left ventricular hypertrophy. Left ventricular diastolic parameters are consistent with Grade I diastolic dysfunction (impaired relaxation).  LV Wall Scoring: The mid and distal lateral wall and apical inferior segment are akinetic. Right Ventricle: The right ventricular size is normal. No increase in right ventricular wall thickness. Right ventricular systolic function is normal. Left Atrium: Left atrial size was normal in size. Right Atrium: Right atrial size was normal in size. Pericardium: There is no evidence of pericardial effusion. Mitral Valve: The mitral valve is normal in structure. Mild mitral valve regurgitation.  No evidence of mitral valve stenosis. Tricuspid Valve: The tricuspid valve is normal in structure. Tricuspid valve regurgitation is not demonstrated. No evidence of tricuspid stenosis. Aortic Valve: The aortic valve is normal in structure. Aortic valve regurgitation is not visualized. No aortic stenosis  is present. Pulmonic Valve: The pulmonic valve was normal in structure. Pulmonic valve regurgitation is not visualized. No evidence of pulmonic stenosis. Aorta: The aortic root is normal in size and structure. Venous: The inferior vena cava is normal in size with greater than 50% respiratory variability, suggesting right atrial pressure of 3 mmHg. IAS/Shunts: No atrial level shunt detected by color flow Doppler.  LEFT VENTRICLE PLAX 2D LVIDd:         5.20 cm      Diastology LVIDs:         4.00 cm      LV e' medial:    4.05 cm/s LV PW:         1.10 cm      LV E/e' medial:  18.8 LV IVS:        1.10 cm      LV e' lateral:   7.43 cm/s LVOT diam:     2.10 cm      LV E/e' lateral: 10.3 LV SV:         69 LV SV Index:   41 LVOT Area:     3.46 cm                             3D Volume EF                             LV 3D EDV:   30200.00 mm LV Volumes (MOD) LV vol d, MOD A2C: 122.0 ml LV vol d, MOD A4C: 147.0 ml LV vol s, MOD A2C: 77.9 ml LV vol s, MOD A4C: 99.7 ml LV SV MOD A2C:     44.1 ml LV SV MOD A4C:     147.0 ml LV SV MOD BP:      40.5 ml RIGHT VENTRICLE RV S prime:     11.60 cm/s TAPSE (M-mode): 2.2 cm LEFT ATRIUM             Index       RIGHT ATRIUM           Index LA diam:        3.40 cm 2.00 cm/m  RA Area:     11.20 cm LA Vol (A2C):   32.9 ml 19.35 ml/m RA Volume:   23.80 ml  14.00 ml/m LA Vol (A4C):   43.5 ml 25.59 ml/m LA Biplane Vol: 40.1 ml 23.59 ml/m  AORTIC VALVE LVOT Vmax:   90.10 cm/s LVOT Vmean:  63.000 cm/s LVOT VTI:    0.199 m  AORTA Ao Root diam: 2.70 cm MITRAL VALVE MV Area (PHT): 2.32 cm     SHUNTS MV Decel Time: 327 msec     Systemic VTI:  0.20 m MV E velocity: 76.30 cm/s   Systemic Diam: 2.10  cm MV A velocity: 106.00 cm/s MV E/A ratio:  0.72 Donato SchultzMark Skains MD Electronically signed by Donato SchultzMark Skains MD Signature Date/Time: 03/05/2020/1:53:57 PM    Final    Disposition   Pt is being discharged home today in good condition.  Follow-up Plans & Appointments     Follow-up Information    Azalee CourseMeng, Hao, [place] Follow up on 04/02/2020.   Specialties: Cardiology, Radiology Why: @ 9:45AM Contact information: [address] [place] [address] [phone]                Discharge Medications   Allergies as of  03/05/2020   No Known Allergies     Medication List    TAKE these medications   calcium carbonate 500 MG chewable tablet Commonly known as: TUMS - dosed in mg elemental calcium Chew 2 tablets by mouth every evening.   metoprolol succinate 50 MG 24 hr tablet Commonly known as: TOPROL-XL Take 1 tablet (50 mg total) by mouth daily. Take with or immediately following a meal.   Multivitamin Adult Tabs Take 1 tablet by mouth every evening.          Outstanding Labs/Studies   N/A  Duration of Discharge Encounter   Greater than 30 minutes including physician time.  Signed, Kathan Kirker David Stall, PA-C 03/05/2020, 3:31 PM

## 2020-03-05 NOTE — Progress Notes (Signed)
  Echocardiogram 2D Echocardiogram with definity has been performed.  Leta Jungling M 03/05/2020, 11:28 AM

## 2020-03-05 NOTE — Plan of Care (Signed)
  Problem: Education: Goal: Knowledge of General Education information will improve Description: Including pain rating scale, medication(s)/side effects and non-pharmacologic comfort measures Outcome: Progressing   Problem: Health Behavior/Discharge Planning: Goal: Ability to manage health-related needs will improve Outcome: Progressing   Problem: Clinical Measurements: Goal: Ability to maintain clinical measurements within normal limits will improve Outcome: Progressing Goal: Will remain free from infection Outcome: Progressing Goal: Respiratory complications will improve Outcome: Progressing Goal: Cardiovascular complication will be avoided Outcome: Progressing   Problem: Activity: Goal: Risk for activity intolerance will decrease Outcome: Progressing   Problem: Coping: Goal: Level of anxiety will decrease Outcome: Progressing   Problem: Elimination: Goal: Will not experience complications related to bowel motility Outcome: Progressing   Problem: Pain Managment: Goal: General experience of comfort will improve Outcome: Progressing   Problem: Education: Goal: Understanding of CV disease, CV risk reduction, and recovery process will improve Outcome: Progressing   Problem: Activity: Goal: Ability to return to baseline activity level will improve Outcome: Progressing

## 2020-03-05 NOTE — Progress Notes (Signed)
   03/05/20 1008  Assess: MEWS Score  BP 125/64  Pulse Rate (!) 58  ECG Heart Rate 60  Level of Consciousness Alert  SpO2 96 %  O2 Device Room Air  Patient Activity (if Appropriate) In bed  Assess: MEWS Score  MEWS Temp 0  MEWS Systolic 0  MEWS Pulse 0  MEWS RR 3  MEWS LOC 0  MEWS Score 3  MEWS Score Color Yellow  Assess: if the MEWS score is Yellow or Red  Were vital signs taken at a resting state? Yes  Focused Assessment No change from prior assessment  Early Detection of Sepsis Score *See Row Information* Low  MEWS guidelines implemented *See Row Information* Yes  Treat  Pain Scale 0-10  Pain Score 0  Take Vital Signs  Increase Vital Sign Frequency  Yellow: Q 2hr X 2 then Q 4hr X 2, if remains yellow, continue Q 4hrs  Escalate  MEWS: Escalate Yellow: discuss with charge nurse/RN and consider discussing with provider and RRT  Notify: Charge Nurse/RN  Name of Charge Nurse/RN Notified Yadiel Aubry, RN  Date Charge Nurse/RN Notified 03/05/20  Time Charge Nurse/RN Notified 1008  Document  Patient Outcome Other (Comment)  Progress note created (see row info) Yes

## 2020-03-05 NOTE — Progress Notes (Signed)
Progress Note  Patient Name: Veronica Hodges Date of Encounter: 03/05/2020  [facility] Cardiologist: Jodelle Red, MD  Subjective   No acute events overnight. Awaiting cath this AM. Didn't sleep given transfer up from ER overnight. No chest pain, breathing stable.  Inpatient Medications    Scheduled Meds: . [MAR Hold] aspirin EC  81 mg Oral Daily  . [MAR Hold] atorvastatin  80 mg Oral Daily  . [START ON 03/06/2020] influenza vaccine adjuvanted  0.5 mL Intramuscular Tomorrow-1000  . [MAR Hold] metoprolol tartrate  25 mg Oral BID  . [START ON 03/06/2020] pneumococcal 23 valent vaccine  0.5 mL Intramuscular Tomorrow-1000  . [MAR Hold] sodium chloride flush  3 mL Intravenous Q12H   Continuous Infusions: . sodium chloride    . sodium chloride 1 mL/kg/hr (03/05/20 0525)  . heparin 800 Units/hr (03/04/20 1848)   PRN Meds: sodium chloride, [MAR Hold] acetaminophen, [MAR Hold] nitroGLYCERIN, [MAR Hold] ondansetron (ZOFRAN) IV, sodium chloride flush   Vital Signs    Vitals:   03/04/20 2200 03/04/20 2300 03/05/20 0410 03/05/20 0859  BP: 110/75 (!) 143/68 132/69   Pulse: 70 75 60   Resp: 13 15 16    Temp:  98.8 F (37.1 C) 98.6 F (37 C)   TempSrc:  Oral Oral   SpO2: 96% 98% 97% 100%  Weight:  63.9 kg 63.5 kg   Height:  5\' 5"  (1.651 m)      Intake/Output Summary (Last 24 hours) at 03/05/2020 0912 Last data filed at 03/05/2020 0309 Gross per 24 hour  Intake 466.45 ml  Output --  Net 466.45 ml   Last 3 Weights 03/05/2020 03/04/2020 03/04/2020  Weight (lbs) 140 lb 140 lb 14.4 oz 145 lb 8.1 oz  Weight (kg) 63.504 kg 63.912 kg 66 kg      Telemetry    NSR - Personally Reviewed  ECG    No new since 12/29 - Personally Reviewed  Physical Exam   GEN: No acute distress.   Neck: No JVD Cardiac: RRR, no murmurs, rubs, or gallops.  Respiratory: Clear to auscultation bilaterally. GI: Soft, nontender, non-distended  MS: No edema; No deformity. Neuro:   Nonfocal  Psych: Normal affect   Labs    High Sensitivity Troponin:   Recent Labs  Lab 03/03/20 1627 03/03/20 2202 03/04/20 0216 03/04/20 1650 03/04/20 2259  TROPONINIHS 11 34* 34* 24* 21*      Chemistry Recent Labs  Lab 03/03/20 1627 03/04/20 1650 03/05/20 0025  NA 138 137 136  K 4.0 3.7 3.3*  CL 101 103 104  CO2 24 22 24   GLUCOSE 133* 97 148*  BUN 13 12 12   CREATININE 0.81 0.70 0.82  CALCIUM 9.8 9.3 9.1  PROT  --  6.6  --   ALBUMIN  --  3.8  --   AST  --  24  --   ALT  --  20  --   ALKPHOS  --  42  --   BILITOT  --  1.0  --   GFRNONAA >60 >60 >60  ANIONGAP 13 12 8      Hematology Recent Labs  Lab 03/03/20 1627 03/04/20 1538 03/05/20 0025  WBC 9.6 7.4 8.8  RBC 4.07 3.72* 3.59*  HGB 13.5 12.0 11.8*  HCT 38.8 36.8 33.6*  MCV 95.3 98.9 93.6  MCH 33.2 32.3 32.9  MCHC 34.8 32.6 35.1  RDW 12.0 12.3 12.1  PLT 334 286 304    BNPNo results for input(s): BNP, PROBNP in the  last 168 hours.   DDimer No results for input(s): DDIMER in the last 168 hours.   Radiology    DG Chest 1 View  Result Date: 03/04/2020 CLINICAL DATA:  Intermittent chest pain EXAM: CHEST  1 VIEW COMPARISON:  None. FINDINGS: No acute consolidation or effusion. Normal cardiac size. No pneumothorax. 17 mm irregular right upper lobe lung nodule. Summation artifact versus tiny nodule left lung apex. IMPRESSION: 1. No acute infiltrate or edema. 2. 17 mm irregular right upper lobe lung nodule. Tiny nodule versus summation artifact at left apex. CT chest recommended for further evaluation. Electronically Signed   By: Jasmine Pang M.D.   On: 03/04/2020 15:31    Cardiac Studies   Echo, cath pending today  Patient Profile     69 y.o. female without significant prior medical history presenting with chest, arm, jaw discomfort concerning for angina with elevation in her hsTnI, evaluating for NSTEMI  Assessment & Plan    Chest pain  Elevated troponin, consistent with NSTEMI  -while her  hsTnI is not profound, it is abnormal and has no other reason this would be expected. Symptoms also highly concerning -see note yesterday, discussed options for further evaluation, will proceed with cath today   -continue heparin, aspirin, statin, beta blocker  -hsTnI 11, 34, 34, 24, 21 -A1c  5.2 -lipids show LDL 110 -echo pending, cath pending -eval for cardiac rehab   Elevated BP: intermittent, largely normal, no prior history of hypertension -continue beta blocker  For questions or updates, please contact [facility] Please consult [email or web address] for contact info under        Signed, Jodelle Red, MD  03/05/2020, 9:12 AM

## 2020-03-05 NOTE — Progress Notes (Signed)
ANTICOAGULATION CONSULT NOTE Pharmacy Consult for Heparin Indication: chest pain/ACS  No Known Allergies  Patient Measurements: Height: 5\' 5"  (165.1 cm) Weight: 47.6 kg (104 lb 14.4 oz) IBW/kg (Calculated) : 57 Heparin Dosing Weight: 66 kg  Vital Signs: Temp: 98.8 F (37.1 C) (12/29 2300) Temp Source: Oral (12/29 2300) BP: 143/68 (12/29 2300) Pulse Rate: 75 (12/29 2300)  Labs: Recent Labs    03/03/20 1627 03/03/20 2202 03/04/20 0216 03/04/20 1538 03/04/20 1650 03/04/20 2259 03/05/20 0025  HGB 13.5  --   --  12.0  --   --  11.8*  HCT 38.8  --   --  36.8  --   --  33.6*  PLT 334  --   --  286  --   --  304  HEPARINUNFRC  --   --   --   --   --   --  0.31  CREATININE 0.81  --   --   --  0.70  --   --   TROPONINIHS 11   < > 34*  --  24* 21*  --    < > = values in this interval not displayed.    Estimated Creatinine Clearance: 49.2 mL/min (by C-G formula based on SCr of 0.7 mg/dL).   Assessment: 69 y.o. female with chest pain for heparin   Goal of Therapy:  Heparin level 0.3-0.7 units/ml Monitor platelets by anticoagulation protocol: Yes   Plan:  Continue Heparin at current rate  Follow up after cath today   Graciana Sessa, 65 PharmD. BCPS  03/05/2020,12:55 AM

## 2020-04-02 ENCOUNTER — Other Ambulatory Visit: Payer: Self-pay | Admitting: Physician Assistant

## 2020-04-02 ENCOUNTER — Ambulatory Visit (INDEPENDENT_AMBULATORY_CARE_PROVIDER_SITE_OTHER): Payer: Medicare Other | Admitting: Physician Assistant

## 2020-04-02 ENCOUNTER — Other Ambulatory Visit: Payer: Self-pay

## 2020-04-02 VITALS — BP 158/76 | HR 86 | Ht 65.0 in | Wt 138.6 lb

## 2020-04-02 DIAGNOSIS — Z79899 Other long term (current) drug therapy: Secondary | ICD-10-CM | POA: Diagnosis not present

## 2020-04-02 DIAGNOSIS — I10 Essential (primary) hypertension: Secondary | ICD-10-CM | POA: Diagnosis not present

## 2020-04-02 DIAGNOSIS — I5181 Takotsubo syndrome: Secondary | ICD-10-CM

## 2020-04-02 MED ORDER — VALSARTAN 40 MG PO TABS: 40.0000 mg | ORAL_TABLET | Freq: Two times a day (BID) | ORAL | 6 refills | Status: DC

## 2020-04-02 NOTE — Patient Instructions (Signed)
Medication Instructions:  Start valsartan 40mg  twice daily  *If you need a refill on your cardiac medications before your next appointment, please call your pharmacy*  Lab Work: BMET IN 7 DAYS If you have labs (blood work) drawn today and your tests are completely normal, you will receive your results only by:  MyChart Message (if you have MyChart) OR A paper copy in the mail.  If you have any lab test that is abnormal or we need to change your treatment, we will call you to review the results. You may go to any [facility] that is convenient for you however, we do have a lab in our office that is able to assist you. You DO NOT need an appointment for our lab. The lab is open 8:00am and closes at 4:00pm. Lunch 12:45 - 1:45pm.  Testing/Procedures: Echocardiogram -(IN 3 MONTHS) Your physician has requested that you have an echocardiogram. Echocardiography is a painless test that uses sound waves to create images of your heart. It provides your doctor with information about the size and shape of your heart and how well your heart's chambers and valves are working. This procedure takes approximately one hour. There are no restrictions for this procedure. This will be performed at our [facility] location - [address], [address].  Special Instructions YOUR BLOOD PRESSURE SHOULD RUN BETWEEN 100-130 IF PERSISTENTLY >140 PLEASE CALL FOR MEDICATION ADJUSTMENT.  Follow-Up: Your next appointment:  AFTER ECHO  In Person with [address], MD OR IF UNAVAILABLE HAO MENG PA-C  At [facility], you and your health needs are our priority.  As part of our continuing mission to provide you with exceptional heart care, we have created designated Provider Care Teams.  These Care Teams include your primary Cardiologist (physician) and Advanced Practice Providers (APPs -  Physician Assistants and Nurse Practitioners) who all work together to provide you with the care you need, when you need it.

## 2020-04-02 NOTE — Progress Notes (Signed)
Cardiology Office Note:    Date:  04/04/2020   ID:  Veronica Hodges, DOB 01-28-1951, MRN [id number]  PCP:  Oneita Hurt, No  [facility] Cardiologist:  Jodelle Red, MD  [facility] Electrophysiologist:  None   Referring MD: No ref. provider found   Chief Complaint  Patient presents with  . Follow-up    Seen for Dr. Cristal Deer    History of Present Illness:    Veronica Hodges is a 70 y.o. female with no prior past medical history recently was admitted to the hospital on 03/04/2020 with NSTEMI.  Prior to that, she moved here along with her husband from [place].  While her serial troponin only peaked at 34, there was no other explanation for her elevated troponin.  Cardiac catheterization was recommended.  She also noted to have elevated blood pressure as well  and was placed on beta-blocker.  She eventually proceeded with cardiac catheterization on 03/05/2020, this revealed EF 50%, anteroapical and inferoapical hypokinesis but basal hyperkinesis, normal left main, normal left circumflex artery, normal RCA, tortuous LAD with mid and distal acute angulation, no obstructive disease.  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  Conservative management was recommended.  Echocardiogram obtained on 03/05/2020 showed EF 30 to 35%, grade 1 DD, mild MR.  Metoprolol tartrate was switched to metoprolol succinate.  Given borderline hyperlipidemia but normal coronary arteries, patient opted not to start on statins.  Patient presents today for follow-up.  She denies any chest discomfort or shortness of breath.  She does have intermittent tension headache and episode of migraine.  Her blood pressure is elevated today, I recommended addition of valsartan 40 mg twice daily to her current regimen.  Her systolic blood pressure goes between 100-130s.  I instructed patient to contact cardiology service if she continued to have systolic blood pressure in the 140s  range.  She will need a basic metabolic panel in 1 week.  Otherwise she can proceed with repeat echocardiogram in 3 months prior to her next follow-up with Dr. Cristal Deer.  Past Medical History:  Diagnosis Date  . Arthritis   . GERD (gastroesophageal reflux disease)     Past Surgical History:  Procedure Laterality Date  . LEFT HEART CATH AND CORONARY ANGIOGRAPHY N/A 03/05/2020   Procedure: LEFT HEART CATH AND CORONARY ANGIOGRAPHY;  Surgeon: Lyn Records, MD;  [facility] IN[facility];  Service: Cardiovascular;  Laterality: N/A;  . TONSILLECTOMY      Current Medications: Current Meds  Medication Sig  . calcium carbonate (TUMS - DOSED IN MG ELEMENTAL CALCIUM) 500 MG chewable tablet Chew 2 tablets by mouth every evening.  . metoprolol succinate (TOPROL-XL) 50 MG 24 hr tablet Take 1 tablet (50 mg total) by mouth daily. Take with or immediately following a meal.  . Multiple Vitamin (MULTIVITAMIN ADULT) TABS Take 1 tablet by mouth every evening.  . valsartan (DIOVAN) 40 MG tablet Take 1 tablet (40 mg total) by mouth 2 (two) times daily.     Allergies:   Patient has no known allergies.   Social History   Socioeconomic History  . Marital status: Married    Spouse name: Not on file  . Number of children: Not on file  . Years of education: Not on file  . Highest education level: Not on file  Occupational History  . Not on file  Tobacco Use  . Smoking status: Former Games developer  . Smokeless tobacco: Never Used  Vaping Use  .  Vaping Use: Never used  Substance and Sexual Activity  . Alcohol use: Yes    Comment: wine with dinner  . Drug use: Never  . Sexual activity: Not on file  Other Topics Concern  . Not on file  Social History Narrative  . Not on file   Social Determinants of Health   Financial Resource Strain: Not on file  Food Insecurity: Not on file  Transportation Needs: Not on file  Physical Activity: Not on file  Stress: Not on file  Social Connections: Not on  file     Family History: The patient's family history includes Hypertension in her father.  ROS:   Please see the history of present illness.     All other systems reviewed and are negative.  EKGs/Labs/Other Studies Reviewed:    The following studies were reviewed today:  Cath 03/05/2020  Right dominant coronary anatomy  Widely patent coronary arteries.    Coronary tortuosity is noted in all 3 territories.  The LAD contains 2 acute vessel angulation/kinks in the mid and distal territory..  No obstructive disease is noted.  Poor quality left ventriculogram demonstrates anteroapical and inferoapical hypokinesis.  Anterobasal inferobasal contractility is hyperdynamic.  LVEDP is 13 mmHg.   Clinical presentation and cath data suggests probable Takotsubo syndrome.  INOCA / MINOCA seem less likely.  RECOMMENDATIONS:   Conservative management  Coated aspirin 81 mg daily    Echo 03/05/2020 1. Left ventricular ejection fraction, by estimation, is 30 to 35%. The  left ventricle has moderately decreased function. The left ventricle  demonstrates global hypokinesis. Left ventricular diastolic parameters are  consistent with Grade I diastolic  dysfunction (impaired relaxation).  2. Right ventricular systolic function is normal. The right ventricular  size is normal.  3. The mitral valve is normal in structure. Mild mitral valve  regurgitation. No evidence of mitral stenosis.  4. The aortic valve is normal in structure. Aortic valve regurgitation is  not visualized. No aortic stenosis is present.  5. The inferior vena cava is normal in size with greater than 50%  respiratory variability, suggesting right atrial pressure of 3 mmHg.     EKG:  EKG is not ordered today.    Recent Labs: 03/04/2020: ALT 20 03/05/2020: BUN 12; Creatinine, Ser 0.82; Hemoglobin 11.8; Platelets 304; Potassium 3.3; Sodium 136  Recent Lipid Panel    Component Value Date/Time   CHOL 189  03/05/2020 0025   TRIG 42 03/05/2020 0025   HDL 71 03/05/2020 0025   CHOLHDL 2.7 03/05/2020 0025   VLDL 8 03/05/2020 0025   LDLCALC 110 (H) 03/05/2020 0025     Risk Assessment/Calculations:       Physical Exam:    VS:  BP (!) 158/76 (BP Location: Left Arm, Patient Position: Sitting, Cuff Size: Normal)   Pulse 86   Ht 5\' 5"  (1.651 m)   Wt 138 lb 9.6 oz (62.9 kg)   SpO2 96%   BMI 23.06 kg/m     Wt Readings from Last 3 Encounters:  04/02/20 138 lb 9.6 oz (62.9 kg)  03/05/20 140 lb (63.5 kg)  03/03/20 147 lb (66.7 kg)     GEN:  Well nourished, well developed in no acute distress HEENT: Normal NECK: No JVD; No carotid bruits LYMPHATICS: No lymphadenopathy CARDIAC: RRR, no murmurs, rubs, gallops RESPIRATORY:  Clear to auscultation without rales, wheezing or rhonchi  ABDOMEN: Soft, non-tender, non-distended MUSCULOSKELETAL:  No edema; No deformity  SKIN: Warm and dry NEUROLOGIC:  Alert and oriented x  3 PSYCHIATRIC:  Normal affect   ASSESSMENT:    1. Takotsubo cardiomyopathy   2. Medication management   3. Primary hypertension    PLAN:    In order of problems listed above:  1. Takotsubo cardiomyopathy: Continue Toprol-XL.  Add valsartan 40 mg twice daily.  Repeat echocardiogram in 3 months  2. Hypertension: Blood pressure elevated today.  We will add losartan 40 mg twice daily to her medical treatment on top of Toprol-XL.  She will need a basic metabolic panel in 1 week.        Medication Adjustments/Labs and Tests Ordered: Current medicines are reviewed at length with the patient today.  Concerns regarding medicines are outlined above.  Orders Placed This Encounter  Procedures  . Basic metabolic panel  . ECHOCARDIOGRAM COMPLETE   Meds ordered this encounter  Medications  . valsartan (DIOVAN) 40 MG tablet    Sig: Take 1 tablet (40 mg total) by mouth 2 (two) times daily.    Dispense:  60 tablet    Refill:  6    Patient Instructions  Medication  Instructions:  Start valsartan 40mg  twice daily  *If you need a refill on your cardiac medications before your next appointment, please call your pharmacy*  Lab Work: BMET IN 7 DAYS If you have labs (blood work) drawn today and your tests are completely normal, you will receive your results only by:  MyChart Message (if you have MyChart) OR A paper copy in the mail.  If you have any lab test that is abnormal or we need to change your treatment, we will call you to review the results. You may go to any [facility] that is convenient for you however, we do have a lab in our office that is able to assist you. You DO NOT need an appointment for our lab. The lab is open 8:00am and closes at 4:00pm. Lunch 12:45 - 1:45pm.  Testing/Procedures: Echocardiogram -(IN 3 MONTHS) Your physician has requested that you have an echocardiogram. Echocardiography is a painless test that uses sound waves to create images of your heart. It provides your doctor with information about the size and shape of your heart and how well your heart's chambers and valves are working. This procedure takes approximately one hour. There are no restrictions for this procedure. This will be performed at our [facility] location - [address], [address].  Special Instructions YOUR BLOOD PRESSURE SHOULD RUN BETWEEN 100-130 IF PERSISTENTLY >140 PLEASE CALL FOR MEDICATION ADJUSTMENT.  Follow-Up: Your next appointment:  AFTER ECHO  In Person with [address], MD OR IF UNAVAILABLE Ryleigh Esqueda PA-C  At [facility], you and your health needs are our priority.  As part of our continuing mission to provide you with exceptional heart care, we have created designated Provider Care Teams.  These Care Teams include your primary Cardiologist (physician) and Advanced Practice Providers (APPs -  Physician Assistants and Nurse Practitioners) who all work together to provide you with the care you need, when you need it.          [facility], Ramond Dial  04/04/2020 11:27 AM    [facility]

## 2020-04-04 ENCOUNTER — Encounter: Payer: Self-pay | Admitting: Physician Assistant

## 2020-04-10 LAB — BASIC METABOLIC PANEL
BUN/Creatinine Ratio: 14 (ref 12–28)
BUN: 12 mg/dL (ref 8–27)
CO2: 25 mmol/L (ref 20–29)
Calcium: 9.6 mg/dL (ref 8.7–10.3)
Chloride: 98 mmol/L (ref 96–106)
Creatinine, Ser: 0.88 mg/dL (ref 0.57–1.00)
GFR calc Af Amer: 77 mL/min/{1.73_m2} (ref >59)
GFR calc non Af Amer: 67 mL/min/{1.73_m2} (ref >59)
Glucose: 99 mg/dL (ref 65–99)
Potassium: 3.9 mmol/L (ref 3.5–5.2)
Sodium: 138 mmol/L (ref 134–144)

## 2020-07-01 ENCOUNTER — Other Ambulatory Visit (HOSPITAL_COMMUNITY): Payer: Medicare Other

## 2020-07-02 ENCOUNTER — Other Ambulatory Visit (HOSPITAL_COMMUNITY): Payer: Medicare Other

## 2020-07-03 ENCOUNTER — Ambulatory Visit: Payer: Medicare Other | Admitting: Cardiology

## 2020-07-14 ENCOUNTER — Ambulatory Visit: Payer: Medicare Other | Admitting: Cardiology

## 2020-07-21 ENCOUNTER — Other Ambulatory Visit: Payer: Self-pay

## 2020-07-21 ENCOUNTER — Ambulatory Visit (HOSPITAL_COMMUNITY): Payer: Medicare Other | Attending: Cardiology

## 2020-07-21 DIAGNOSIS — I5181 Takotsubo syndrome: Secondary | ICD-10-CM | POA: Diagnosis present

## 2020-07-21 LAB — ECHOCARDIOGRAM COMPLETE
Area-P 1/2: 3.6 cm2
S' Lateral: 4 cm

## 2020-07-21 NOTE — Progress Notes (Signed)
Cardiology Clinic Note   Patient Name: Veronica Hodges Date of Encounter: 07/22/2020  Primary Care Provider:  Pcp, No Primary Cardiologist:  Jodelle Red, MD  Patient Profile    Veronica Hodges 70 year old female presents to the clinic today for follow-up evaluation and review of her echocardiogram.  Past Medical History    Past Medical History:  Diagnosis Date  . Arthritis   . GERD (gastroesophageal reflux disease)    Past Surgical History:  Procedure Laterality Date  . LEFT HEART CATH AND CORONARY ANGIOGRAPHY N/A 03/05/2020   Procedure: LEFT HEART CATH AND CORONARY ANGIOGRAPHY;  Surgeon: Lyn Records, MD;  Location: [facility] INVAS[facility] CV LAB;  Service: Cardiovascular;  Laterality: N/A;  . TONSILLECTOMY      Allergies  No Known Allergies  History of Present Illness    [name] has a PMH of primary hypertension, NSTEMI, and Takotsubo cardiomyopathy.  She was admitted to the hospital 03/04/2020 with diagnosis of NSTEMI.  She had moved to [place] from [place] with her husband.  While in the emergency department her troponins peaked at 34.  She underwent cardiac catheterization.  Her blood pressure was elevated and she was placed on beta blocking therapy.  Her cardiac catheterization 03/05/2020 showed an LVEF of 50%, anteroapical and inferior apical hypokinesis with basal hypokinesis, normal left main, normal left circumflex, normal RCA, and torturous LAD.  No obstructive disease was noted.  It was felt that her troponins were elevated due to stress-induced cardiomyopathy.  Conservative management was recommended.  Her echocardiogram 03/05/2020 showed an EF of 30-35%, G1 DD, and mild MR.  Her metoprolol tartrate was switched to metoprolol succinate.  Due to her borderline hyperlipidemia and abnormal coronary arteries she opted not to start statin therapy.  She was seen by Azalee Course, PA-C on 04/02/2020.  During that time she denied chest pain and shortness of  breath.  She did notice intermittent tension type headaches and episodes of migraine.  Her blood pressure was elevated at that time.  She was started on valsartan 40 mg twice daily.  Follow-up echocardiogram was planned for 3 months.  Her follow-up echocardiogram showed an ejection fraction of 30-35%, G1 DD, and mild MR.  We reviewed her echocardiogram.  She expressed understanding.  I will stop her valsartan and place her on Entresto 49/51.  She reports that she is very physically active walking 4-5 miles per day.  She denies increased work of breathing with these increased physical activities.  She has been trying to limit the sodium in her diet.  She reports that her blood pressure at home is usually in the 120s over 70s.  I will give her a blood pressure log.  We will order a BMP for 1 week and have her follow-up in 1 month.  Today she denies chest pain, shortness of breath, lower extremity edema, fatigue, palpitations, melena, hematuria, hemoptysis, diaphoresis, weakness, presyncope, syncope, orthopnea, and PND.   Home Medications    Prior to Admission medications   Medication Sig Start Date End Date Taking? Authorizing Provider  calcium carbonate (TUMS - DOSED IN MG ELEMENTAL CALCIUM) 500 MG chewable tablet Chew 2 tablets by mouth every evening.    [provider]  metoprolol succinate (TOPROL-XL) 50 MG 24 hr tablet Take 1 tablet (50 mg total) by mouth daily. Take with or immediately following a meal. 03/05/20   Furth, Cadence H, PA-C  Multiple Vitamin (MULTIVITAMIN ADULT) TABS Take 1 tablet by mouth every evening.  [provider]  valsartan (DIOVAN) 40 MG tablet Take 1 tablet (40 mg total) by mouth 2 (two) times daily. 04/02/20   Azalee Course, PA    Family History    Family History  Problem Relation Age of Onset  . Hypertension Father    She indicated that the status of her father is unknown.  Social History    Social History   Socioeconomic History  . Marital  status: Married    Spouse name: Not on file  . Number of children: Not on file  . Years of education: Not on file  . Highest education level: Not on file  Occupational History  . Not on file  Tobacco Use  . Smoking status: Former Games developer  . Smokeless tobacco: Never Used  Vaping Use  . Vaping Use: Never used  Substance and Sexual Activity  . Alcohol use: Yes    Comment: wine with dinner  . Drug use: Never  . Sexual activity: Not on file  Other Topics Concern  . Not on file  Social History Narrative  . Not on file   Social Determinants of Health   Financial Resource Strain: Not on file  Food Insecurity: Not on file  Transportation Needs: Not on file  Physical Activity: Not on file  Stress: Not on file  Social Connections: Not on file  Intimate Partner Violence: Not on file     Review of Systems    General:  No chills, fever, night sweats or weight changes.  Cardiovascular:  No chest pain, dyspnea on exertion, edema, orthopnea, palpitations, paroxysmal nocturnal dyspnea. Dermatological: No rash, lesions/masses Respiratory: No cough, dyspnea Urologic: No hematuria, dysuria Abdominal:   No nausea, vomiting, diarrhea, bright red blood per rectum, melena, or hematemesis Neurologic:  No visual changes, wkns, changes in mental status. All other systems reviewed and are otherwise negative except as noted above.  Physical Exam    VS:  BP (!) 158/76 (BP Location: Right Arm, Patient Position: Sitting, Cuff Size: Normal)   Pulse 68   Ht 5\' 5"  (1.651 m)   Wt 131 lb (59.4 kg)   BMI 21.80 kg/m  , BMI Body mass index is 21.8 kg/m. GEN: Well nourished, well developed, in no acute distress. HEENT: normal. Neck: Supple, no JVD, carotid bruits, or masses. Cardiac: RRR, no murmurs, rubs, or gallops. No clubbing, cyanosis, edema.  Radials/DP/PT 2+ and equal bilaterally.  Respiratory:  Respirations regular and unlabored, clear to auscultation bilaterally. GI: Soft, nontender,  nondistended, BS + x 4. MS: no deformity or atrophy. Skin: warm and dry, no rash. Neuro:  Strength and sensation are intact. Psych: Normal affect.  Accessory Clinical Findings    Recent Labs: 03/04/2020: ALT 20 03/05/2020: Hemoglobin 11.8; Platelets 304 04/10/2020: BUN 12; Creatinine, Ser 0.88; Potassium 3.9; Sodium 138   Recent Lipid Panel    Component Value Date/Time   CHOL 189 03/05/2020 0025   TRIG 42 03/05/2020 0025   HDL 71 03/05/2020 0025   CHOLHDL 2.7 03/05/2020 0025   VLDL 8 03/05/2020 0025   LDLCALC 110 (H) 03/05/2020 0025    ECG personally reviewed by me today-none today. ECHO 07/21/20 IMPRESSIONS    1. Global hypokinesis with akinesis of the inferolateral wall; overall  moderate to severe LV dysfunction.  2. Left ventricular ejection fraction, by estimation, is 30 to 35%. The  left ventricle has moderate to severely decreased function. The left  ventricle demonstrates regional wall motion abnormalities (see scoring  diagram/findings for description). The  left  ventricular internal cavity size was mildly dilated. Left ventricular  diastolic parameters are consistent with Grade I diastolic dysfunction  (impaired relaxation). Elevated left atrial pressure.  3. Right ventricular systolic function is normal. The right ventricular  size is normal.  4. Left atrial size was mildly dilated.  5. The mitral valve is normal in structure. Mild mitral valve  regurgitation. No evidence of mitral stenosis.  6. The aortic valve is tricuspid. Aortic valve regurgitation is not  visualized. No aortic stenosis is present.  7. The inferior vena cava is normal in size with greater than 50%  respiratory variability, suggesting right atrial pressure of 3 mmHg.  Echocardiogram 03/05/2020 IMPRESSIONS    1. Left ventricular ejection fraction, by estimation, is 30 to 35%. The  left ventricle has moderately decreased function. The left ventricle  demonstrates global  hypokinesis. Left ventricular diastolic parameters are  consistent with Grade I diastolic  dysfunction (impaired relaxation).  2. Right ventricular systolic function is normal. The right ventricular  size is normal.  3. The mitral valve is normal in structure. Mild mitral valve  regurgitation. No evidence of mitral stenosis.  4. The aortic valve is normal in structure. Aortic valve regurgitation is  not visualized. No aortic stenosis is present.  5. The inferior vena cava is normal in size with greater than 50%  respiratory variability, suggesting right atrial pressure of 3 mmHg.  Cardiac catheterization 03/05/2020  Right dominant coronary anatomy  Widely patent coronary arteries.    Coronary tortuosity is noted in all 3 territories.  The LAD contains 2 acute vessel angulation/kinks in the mid and distal territory..  No obstructive disease is noted.  Poor quality left ventriculogram demonstrates anteroapical and inferoapical hypokinesis.  Anterobasal inferobasal contractility is hyperdynamic.  LVEDP is 13 mmHg.   Clinical presentation and cath data suggests probable Takotsubo syndrome.  INOCA / MINOCA seem less likely.  RECOMMENDATIONS:   Conservative management  Coated aspirin 81 mg daily  Diagnostic Dominance: Right    Intervention    Assessment & Plan   1.  Stress-induced cardiomyopathy- no increased DOE or activity intolerance.  Denies chest pain.  Cardiac catheterization showed no coronary disease.  EF 03/05/2020 was 30-35%.  Follow-up echocardiogram showed LVEF 30-35 %, G1DD Continue  Metoprolol Start Entresto 49/51 Stop valsartan Heart healthy low-sodium diet-salty 6 given Increase physical activity as tolerated Order BMP  Hypertension-BP today 158/76.  Well-controlled at home 120s over 70s Continue metoprolol Start Entresto 49/51 Heart healthy low-sodium diet-salty 6 given Increase physical activity as tolerated Maintain blood pressure  log   Disposition: Follow-up with Dr. Cristal Deer or me in 1 month  Thomasene Ripple. Shamirah Ivan NP-C    07/22/2020, 1:42 PM [facility] [address] Office [phone] Fax [phone]  Notice: This dictation was prepared with Dragon dictation along with smaller phrase technology. Any transcriptional errors that result from this process are unintentional and may not be corrected upon review.  I spent 12 minutes examining this patient, reviewing medications, and using patient centered shared decision making involving her cardiac care.  Prior to her visit I spent greater than 20 minutes reviewing her past medical history,  medications, and prior cardiac tests.

## 2020-07-22 ENCOUNTER — Ambulatory Visit (INDEPENDENT_AMBULATORY_CARE_PROVIDER_SITE_OTHER): Payer: Medicare Other | Admitting: General Practice

## 2020-07-22 ENCOUNTER — Encounter: Payer: Self-pay | Admitting: General Practice

## 2020-07-22 VITALS — BP 158/76 | HR 68 | Ht 65.0 in | Wt 131.0 lb

## 2020-07-22 DIAGNOSIS — I5181 Takotsubo syndrome: Secondary | ICD-10-CM

## 2020-07-22 DIAGNOSIS — I10 Essential (primary) hypertension: Secondary | ICD-10-CM | POA: Diagnosis not present

## 2020-07-22 DIAGNOSIS — Z79899 Other long term (current) drug therapy: Secondary | ICD-10-CM | POA: Diagnosis not present

## 2020-07-22 MED ORDER — SACUBITRIL-VALSARTAN 49-51 MG PO TABS: 1.0000 | ORAL_TABLET | Freq: Two times a day (BID) | ORAL | 3 refills | Status: DC

## 2020-07-22 NOTE — Patient Instructions (Signed)
Medication Instructions:  STOP VALSARTAN  START ENTRESTO 49/51MG  TWICE DAILY *If you need a refill on your cardiac medications before your next appointment, please call your pharmacy*  Lab Work: BMET IN 1 WEEK (07-29-20) If you have labs (blood work) drawn today and your tests are completely normal, you will receive your results only by:  MyChart Message (if you have MyChart) OR A paper copy in the mail.  If you have any lab test that is abnormal or we need to change your treatment, we will call you to review the results. You may go to any [facility] that is convenient for you however, we do have a lab in our office that is able to assist you. You DO NOT need an appointment for our lab. The lab is open 8:00am and closes at 4:00pm. Lunch 12:45 - 1:45pm.  Special Instructions TAKE AND LOG YOUR BLOOD PRESSURE AND BRING LOG WITH YOU TO YOUR FOLLOW APPOINTMENT.  PLEASE READ AND FOLLOW SALTY 6-ATTACHED-1,800 mg daily  PLEASE MAINTAIN PHYSICAL ACTIVITY AS TOLERATED  Follow-Up: Your next appointment:  1 month(s) In Person with Jodelle Red, MD OR IF UNAVAILABLE JESSE CLEAVER, FNP-C   At [facility], you and your health needs are our priority.  As part of our continuing mission to provide you with exceptional heart care, we have created designated Provider Care Teams.  These Care Teams include your primary Cardiologist (physician) and Advanced Practice Providers (APPs -  Physician Assistants and Nurse Practitioners) who all work together to provide you with the care you need, when you need it.

## 2020-07-30 LAB — BASIC METABOLIC PANEL
BUN/Creatinine Ratio: 23 (ref 12–28)
BUN: 20 mg/dL (ref 8–27)
CO2: 23 mmol/L (ref 20–29)
Calcium: 9.6 mg/dL (ref 8.7–10.3)
Chloride: 100 mmol/L (ref 96–106)
Creatinine, Ser: 0.87 mg/dL (ref 0.57–1.00)
Glucose: 86 mg/dL (ref 65–99)
Potassium: 4.4 mmol/L (ref 3.5–5.2)
Sodium: 137 mmol/L (ref 134–144)
eGFR: 71 mL/min/{1.73_m2} (ref >59)

## 2020-08-18 NOTE — Progress Notes (Signed)
Cardiology Clinic Note   Patient Name: Veronica Hodges Date of Encounter: 08/20/2020  Primary Care Provider:  Pcp, No Primary Cardiologist:  Jodelle Red, MD  Patient Profile    Veronica Hodges 70 year old female presents to the clinic today for follow-up evaluation and review of her echocardiogram.  Past Medical History    Past Medical History:  Diagnosis Date   Arthritis    GERD (gastroesophageal reflux disease)    Past Surgical History:  Procedure Laterality Date   LEFT HEART CATH AND CORONARY ANGIOGRAPHY N/A 03/05/2020   Procedure: LEFT HEART CATH AND CORONARY ANGIOGRAPHY;  Surgeon: Lyn Records, MD;  Location: [facility];  Service: Cardiovascular;  Laterality: N/A;   TONSILLECTOMY      Allergies  No Known Allergies  History of Present Illness    [name] has a PMH of primary hypertension, NSTEMI, and Takotsubo cardiomyopathy.   She was admitted to the hospital 03/04/2020 with diagnosis of NSTEMI.  She had moved to [place] from [place] with her husband.  While in the emergency department her troponins peaked at 34.  She underwent cardiac catheterization.  Her blood pressure was elevated and she was placed on beta blocking therapy.  Her cardiac catheterization 03/05/2020 showed an LVEF of 50%, anteroapical and inferior apical hypokinesis with basal hypokinesis, normal left main, normal left circumflex, normal RCA, and torturous LAD.  No obstructive disease was noted.  It was felt that her troponins were elevated due to stress-induced cardiomyopathy.  Conservative management was recommended.  Her echocardiogram 03/05/2020 showed an EF of 30-35%, G1 DD, and mild MR.  Her metoprolol tartrate was switched to metoprolol succinate.  Due to her borderline hyperlipidemia and abnormal coronary arteries she opted not to start statin therapy.   She was seen by Azalee Course, PA-C on 04/02/2020.  During that time she denied chest pain and shortness of  breath.  She did notice intermittent tension type headaches and episodes of migraine.  Her blood pressure was elevated at that time.  She was started on valsartan 40 mg twice daily.  Follow-up echocardiogram was planned for 3 months.   Her follow-up echocardiogram showed an ejection fraction of 30-35%, G1 DD, and mild MR.  We reviewed her echocardiogram.  She expressed understanding.  I  stopped her valsartan and placed her on Entresto 49/51.  She reported that she was very physically active walking 4-5 miles per day.  She denied increased work of breathing with these increased physical activities.  She had been trying to limit the sodium in her diet.  She reported that her blood pressure at home was usually in the 120s over 70s.  I  gave her a blood pressure log.  We ordered a BMP  and planned follow-up in 1 month.  Her BMP was unremarkable.  She presents the clinic today for follow-up evaluation states she feels well.  She has noticed occasional episodes of dizziness with bending over.  She reports that she is playing more table tennis with her husband and occasionally when she bends over to pick up a ping-pong ball she notices lightheadedness.  She has reduced her outside physical activity due to the.  She continues to walk 1 to 2 miles inside and do housework.  We reviewed her medications and blood pressure log.  Her blood pressure since transitioning to Entresto from losartan have stayed in the 100-85 range over 50s-60s.  I will repeat her echocardiogram in 1 month, continue her current physical activity,  and low-salt diet.  We will have her follow-up in 3 to 4 months.   Today she denies chest pain, shortness of breath, lower extremity edema, fatigue, palpitations, melena, hematuria, hemoptysis, diaphoresis, weakness, presyncope, syncope, orthopnea, and PND.  Home Medications    Prior to Admission medications   Medication Sig Start Date End Date Taking? Authorizing Provider  calcium carbonate (TUMS  - DOSED IN MG ELEMENTAL CALCIUM) 500 MG chewable tablet Chew 2 tablets by mouth every evening.    [provider]  diphenhydrAMINE (BENADRYL) 25 MG tablet Take 25 mg by mouth every 6 (six) hours as needed.    [provider]  metoprolol succinate (TOPROL-XL) 50 MG 24 hr tablet Take 1 tablet (50 mg total) by mouth daily. Take with or immediately following a meal. 03/05/20   Furth, Cadence H, PA-C  Multiple Vitamin (MULTIVITAMIN ADULT) TABS Take 1 tablet by mouth every evening.    [provider]  sacubitril-valsartan (ENTRESTO) 49-51 MG Take 1 tablet by mouth 2 (two) times daily. 07/22/20   Ronney Asters, NP    Family History    Family History  Problem Relation Age of Onset   Hypertension Father    She indicated that the status of her father is unknown.  Social History    Social History   Socioeconomic History   Marital status: Married    Spouse name: Not on file   Number of children: Not on file   Years of education: Not on file   Highest education level: Not on file  Occupational History   Not on file  Tobacco Use   Smoking status: Former    Pack years: 0.00   Smokeless tobacco: Never  Vaping Use   Vaping Use: Never used  Substance and Sexual Activity   Alcohol use: Yes    Comment: wine with dinner   Drug use: Never   Sexual activity: Not on file  Other Topics Concern   Not on file  Social History Narrative   Not on file   Social Determinants of Health   Financial Resource Strain: Not on file  Food Insecurity: Not on file  Transportation Needs: Not on file  Physical Activity: Not on file  Stress: Not on file  Social Connections: Not on file  Intimate Partner Violence: Not on file     Review of Systems    General:  No chills, fever, night sweats or weight changes.  Cardiovascular:  No chest pain, dyspnea on exertion, edema, orthopnea, palpitations, paroxysmal nocturnal dyspnea. Dermatological: No rash, lesions/masses Respiratory:  No cough, dyspnea Urologic: No hematuria, dysuria Abdominal:   No nausea, vomiting, diarrhea, bright red blood per rectum, melena, or hematemesis Neurologic:  No visual changes, wkns, changes in mental status. All other systems reviewed and are otherwise negative except as noted above.  Physical Exam    VS:  BP (!) 152/72 (BP Location: Left Arm, Patient Position: Sitting, Cuff Size: Normal)   Pulse 64   Ht 5\' 5"  (1.651 m)   Wt 130 lb (59 kg)   BMI 21.63 kg/m  , BMI Body mass index is 21.63 kg/m. GEN: Well nourished, well developed, in no acute distress. HEENT: normal. Neck: Supple, no JVD, carotid bruits, or masses. Cardiac: RRR, no murmurs, rubs, or gallops. No clubbing, cyanosis, edema.  Radials/DP/PT 2+ and equal bilaterally.  Respiratory:  Respirations regular and unlabored, clear to auscultation bilaterally. GI: Soft, nontender, nondistended, BS + x 4. MS: no deformity or atrophy. Skin: warm and dry,  no rash. Neuro:  Strength and sensation are intact. Psych: Normal affect.  Accessory Clinical Findings    Recent Labs: 03/04/2020: ALT 20 03/05/2020: Hemoglobin 11.8; Platelets 304 07/30/2020: BUN 20; Creatinine, Ser 0.87; Potassium 4.4; Sodium 137   Recent Lipid Panel    Component Value Date/Time   CHOL 189 03/05/2020 0025   TRIG 42 03/05/2020 0025   HDL 71 03/05/2020 0025   CHOLHDL 2.7 03/05/2020 0025   VLDL 8 03/05/2020 0025   LDLCALC 110 (H) 03/05/2020 0025    ECG personally reviewed by me today- none today.   ECHO 07/21/20 IMPRESSIONS     1. Global hypokinesis with akinesis of the inferolateral wall; overall  moderate to severe LV dysfunction.   2. Left ventricular ejection fraction, by estimation, is 30 to 35%. The  left ventricle has moderate to severely decreased function. The left  ventricle demonstrates regional wall motion abnormalities (see scoring  diagram/findings for description). The  left ventricular internal cavity size was mildly dilated. Left  ventricular  diastolic parameters are consistent with Grade I diastolic dysfunction  (impaired relaxation). Elevated left atrial pressure.   3. Right ventricular systolic function is normal. The right ventricular  size is normal.   4. Left atrial size was mildly dilated.   5. The mitral valve is normal in structure. Mild mitral valve  regurgitation. No evidence of mitral stenosis.   6. The aortic valve is tricuspid. Aortic valve regurgitation is not  visualized. No aortic stenosis is present.   7. The inferior vena cava is normal in size with greater than 50%  respiratory variability, suggesting right atrial pressure of 3 mmHg.   Echocardiogram 03/05/2020 IMPRESSIONS     1. Left ventricular ejection fraction, by estimation, is 30 to 35%. The  left ventricle has moderately decreased function. The left ventricle  demonstrates global hypokinesis. Left ventricular diastolic parameters are  consistent with Grade I diastolic  dysfunction (impaired relaxation).   2. Right ventricular systolic function is normal. The right ventricular  size is normal.   3. The mitral valve is normal in structure. Mild mitral valve  regurgitation. No evidence of mitral stenosis.   4. The aortic valve is normal in structure. Aortic valve regurgitation is  not visualized. No aortic stenosis is present.   5. The inferior vena cava is normal in size with greater than 50%  respiratory variability, suggesting right atrial pressure of 3 mmHg.   Cardiac catheterization 03/05/2020 Right dominant coronary anatomy Widely patent coronary arteries.   Coronary tortuosity is noted in all 3 territories.  The LAD contains 2 acute vessel angulation/kinks in the mid and distal territory..  No obstructive disease is noted. Poor quality left ventriculogram demonstrates anteroapical and inferoapical hypokinesis.  Anterobasal inferobasal contractility is hyperdynamic.  LVEDP is 13 mmHg.  Clinical presentation and cath data  suggests probable Takotsubo syndrome.  INOCA / MINOCA seem less likely.   RECOMMENDATIONS:   Conservative management Coated aspirin 81 mg daily   Diagnostic Dominance: Right      Intervention      Assessment & Plan   1.  Stress-induced cardiomyopathy- no increased DOE or activity intolerance.  Denies chest pain.  Cardiac catheterization showed no coronary disease.  EF 03/05/2020 was 30-35%.  Follow-up echocardiogram showed LVEF 30-35 %, G1DD Continue  Metoprolol, Entresto Heart healthy low-sodium diet-salty 6 given Increase physical activity as tolerated Repeat echocardiogram in 1 month  Hypertension-BP today 152/72.  Well-controlled at home 10ss over 60's Continue metoprolol Continue Entresto  Heart healthy  low-sodium diet-salty 6 given Increase physical activity as tolerated Maintain blood pressure log     Disposition: Follow-up with Dr. Cristal Deerhristopher or me in 3-4 months   Thomasene RippleJesse M. Madalyn Legner NP-C    08/20/2020, 11:04 AM [facility] [address] Office [phone] Fax [phone]  Notice: This dictation was prepared with Dragon dictation along with smaller phrase technology. Any transcriptional errors that result from this process are unintentional and may not be corrected upon review.  I spent 12 minutes examining this patient, reviewing medications, and using patient centered shared decision making involving her cardiac care.  Prior to her visit I spent greater than 20 minutes reviewing her past medical history,  medications, and prior cardiac tests.

## 2020-08-20 ENCOUNTER — Encounter: Payer: Self-pay | Admitting: General Practice

## 2020-08-20 ENCOUNTER — Other Ambulatory Visit: Payer: Self-pay

## 2020-08-20 ENCOUNTER — Ambulatory Visit (INDEPENDENT_AMBULATORY_CARE_PROVIDER_SITE_OTHER): Payer: Medicare Other | Admitting: General Practice

## 2020-08-20 VITALS — BP 152/72 | HR 64 | Ht 65.0 in | Wt 130.0 lb

## 2020-08-20 DIAGNOSIS — I10 Essential (primary) hypertension: Secondary | ICD-10-CM

## 2020-08-20 DIAGNOSIS — I5181 Takotsubo syndrome: Secondary | ICD-10-CM | POA: Diagnosis not present

## 2020-08-20 NOTE — Patient Instructions (Signed)
Medication Instructions:  The current medical regimen is effective;  continue present plan and medications as directed. Please refer to the Current Medication list given to you today.  *If you need a refill on your cardiac medications before your next appointment, please call your pharmacy*  Testing/Procedures:  Echocardiogram(IN 1 MONTH) - Your physician has requested that you have an echocardiogram. Echocardiography is a painless test that uses sound waves to create images of your heart. It provides your doctor with information about the size and shape of your heart and how well your heart's chambers and valves are working. This procedure takes approximately one hour. There are no restrictions for this procedure. This will be performed at our [facility] location - [address], [address].  Special Instructions TAKE AND LOG YOUR BLOOD PRESSURE 2-3 TIMES MONTHLY  PLEASE MAINTAIN PHYSICAL ACTIVITY AS TOLERATED  Follow-Up: Your next appointment:  3 month(s) In Person with Jodelle Red, MD OR IF UNAVAILABLE JESSE CLEAVER, FNP-C  At [facility], you and your health needs are our priority.  As part of our continuing mission to provide you with exceptional heart care, we have created designated Provider Care Teams.  These Care Teams include your primary Cardiologist (physician) and Advanced Practice Providers (APPs -  Physician Assistants and Nurse Practitioners) who all work together to provide you with the care you need, when you need it.

## 2020-08-25 ENCOUNTER — Other Ambulatory Visit (HOSPITAL_BASED_OUTPATIENT_CLINIC_OR_DEPARTMENT_OTHER): Payer: Self-pay

## 2020-08-25 ENCOUNTER — Ambulatory Visit: Payer: Medicare Other | Attending: Internal Medicine

## 2020-08-25 DIAGNOSIS — Z23 Encounter for immunization: Secondary | ICD-10-CM

## 2020-08-25 MED ORDER — COVID-19 MRNA VACC (MODERNA) 100 MCG/0.5ML IM SUSP
INTRAMUSCULAR | 0 refills | Status: DC
  Filled 2020-08-25: qty 0.25, 1d supply, fill #0

## 2020-08-25 NOTE — Progress Notes (Signed)
   Covid-19 Vaccination Clinic  Name:  Veronica Hodges    MRN: [id number] DOB: 03-03-1951  08/25/2020  Ms. Veronica Hodges was observed post Covid-19 immunization for 15 minutes without incident. She was provided with Vaccine Information Sheet and instruction to access the V-Safe system.   Ms. Veronica Hodges was instructed to call 911 with any severe reactions post vaccine: Difficulty breathing  Swelling of face and throat  A fast heartbeat  A bad rash all over body  Dizziness and weakness   Immunizations Administered     Name Date Dose VIS Date Route   Moderna Covid-19 Booster Vaccine 08/25/2020 10:31 AM 0.25 mL 12/25/2019 Intramuscular   Manufacturer: Moderna   Lot: [id number]   NDC: 99774-142-39

## 2020-09-18 ENCOUNTER — Ambulatory Visit (HOSPITAL_COMMUNITY): Payer: Medicare Other | Attending: Cardiovascular Disease

## 2020-09-18 ENCOUNTER — Other Ambulatory Visit: Payer: Self-pay

## 2020-09-18 DIAGNOSIS — I5181 Takotsubo syndrome: Secondary | ICD-10-CM | POA: Insufficient documentation

## 2020-09-18 LAB — ECHOCARDIOGRAM COMPLETE
Area-P 1/2: 4.33 cm2
S' Lateral: 4.7 cm

## 2020-11-02 ENCOUNTER — Other Ambulatory Visit: Payer: Self-pay | Admitting: *Deleted

## 2020-11-02 MED ORDER — METOPROLOL SUCCINATE ER 50 MG PO TB24: 50.0000 mg | ORAL_TABLET | Freq: Every day | ORAL | 3 refills | Status: DC

## 2020-11-16 ENCOUNTER — Other Ambulatory Visit: Payer: Self-pay

## 2020-11-16 ENCOUNTER — Ambulatory Visit (INDEPENDENT_AMBULATORY_CARE_PROVIDER_SITE_OTHER): Payer: Medicare Other | Admitting: Cardiology

## 2020-11-16 ENCOUNTER — Encounter (HOSPITAL_BASED_OUTPATIENT_CLINIC_OR_DEPARTMENT_OTHER): Payer: Self-pay | Admitting: Cardiology

## 2020-11-16 VITALS — BP 110/70 | HR 60 | Ht 65.0 in | Wt 128.3 lb

## 2020-11-16 DIAGNOSIS — I10 Essential (primary) hypertension: Secondary | ICD-10-CM

## 2020-11-16 DIAGNOSIS — I5181 Takotsubo syndrome: Secondary | ICD-10-CM

## 2020-11-16 DIAGNOSIS — Z7189 Other specified counseling: Secondary | ICD-10-CM | POA: Diagnosis not present

## 2020-11-16 MED ORDER — VALSARTAN 40 MG PO TABS: 40.0000 mg | ORAL_TABLET | Freq: Every day | ORAL | 3 refills | Status: DC

## 2020-11-16 NOTE — Patient Instructions (Signed)
Medication Instructions:  STOP ENTRESTO   START VALSARTAN 40 MG DAILY   *If you need a refill on your cardiac medications before your next appointment, please call your pharmacy*  Lab Work: NONE  Testing/Procedures: NONE  Follow-Up: At [facility], you and your health needs are our priority.  As part of our continuing mission to provide you with exceptional heart care, we have created designated Provider Care Teams.  These Care Teams include your primary Cardiologist (physician) and Advanced Practice Providers (APPs -  Physician Assistants and Nurse Practitioners) who all work together to provide you with the care you need, when you need it.  We recommend signing up for the patient portal called "MyChart".  Sign up information is provided on this After Visit Summary.  MyChart is used to connect with patients for Virtual Visits (Telemedicine).  Patients are able to view lab/test results, encounter notes, upcoming appointments, etc.  Non-urgent messages can be sent to your provider as well.   To learn more about what you can do with MyChart, go to [email or web address].    Your next appointment:   6 month(s)  The format for your next appointment:   In Person  Provider:   Jodelle Red, MD

## 2020-11-16 NOTE — Progress Notes (Signed)
Cardiology Office Note:    Date:  11/16/2020   ID:  [name], DOB 03-23-1950, MRN [id number]  PCP:  Pcp, No  Cardiologist:  Jodelle Red, MD  CC: follow up  History of Present Illness:    Veronica Hodges is a 70 y.o. female with a hx of takotsubo cardiomyopathy, seen for follow up today.  Today, she has been feeling pretty good overall. However, she has not been walking as much as she used to (previously up to 5 miles a day). She is mostly limited by the hot weather, which sometimes causes lightheadedness by the time she returns home.   Also, she becomes dizzy occasionally while standing up or while walking. She describes her episodes of dizziness as a "wave" coming over her that makes her feel near-syncopal.   At home her blood pressure is frequently in the 90s systolic.   Additionally, she has more severe joint pain and backaches which she attributes to side effects of Entresto. Her joint pain was mild while she was only on valsartan.  Her daily supplements currently includes calcium.  She denies any palpitations, chest pain, or shortness of breath. No headaches, orthopnea, or PND. Also has no lower extremity edema.   Past Medical History:  Diagnosis Date   Arthritis    GERD (gastroesophageal reflux disease)     Past Surgical History:  Procedure Laterality Date   LEFT HEART CATH AND CORONARY ANGIOGRAPHY N/A 03/05/2020   Procedure: LEFT HEART CATH AND CORONARY ANGIOGRAPHY;  Surgeon: Lyn Records, MD;  Location: [facility];  Service: Cardiovascular;  Laterality: N/A;   TONSILLECTOMY      Current Medications: Current Outpatient Medications on File Prior to Visit  Medication Sig   calcium carbonate (TUMS - DOSED IN MG ELEMENTAL CALCIUM) 500 MG chewable tablet Chew 2 tablets by mouth every evening.   COVID-19 mRNA vaccine, Moderna, 100 MCG/0.5ML injection Inject into the muscle.   diphenhydrAMINE (BENADRYL) 25 MG tablet Take 25 mg by mouth  every 6 (six) hours as needed.   metoprolol succinate (TOPROL-XL) 50 MG 24 hr tablet Take 1 tablet (50 mg total) by mouth daily. Take with or immediately following a meal.   Multiple Vitamin (MULTIVITAMIN ADULT) TABS Take 1 tablet by mouth every evening.   No current facility-administered medications on file prior to visit.     Allergies:   Patient has no known allergies.   Social History   Tobacco Use   Smoking status: Former   Smokeless tobacco: Never  Building services engineer Use: Never used  Substance Use Topics   Alcohol use: Yes    Comment: wine with dinner   Drug use: Never    Family History: family history includes Hypertension in her father.  ROS:   Please see the history of present illness. (+) Lightheadedness/Dizziness (+) Near-syncope (+) Joint pain (+) Back pain All other systems are reviewed and negative.    EKGs/Labs/Other Studies Reviewed:    The following studies were reviewed today:  Echo 09/18/2020: 1. Left ventricular ejection fraction, by estimation, is 50 to 55%. The  left ventricle has low normal function. The left ventricle has no regional wall motion abnormalities. The left ventricular internal cavity size was mildly dilated. Left ventricular diastolic parameters are consistent with Grade I diastolic dysfunction (impaired relaxation). Elevated left ventricular end-diastolic pressure.  The average left ventricular global longitudinal strain is -21.7 %. The  global longitudinal strain is normal.   2. Right ventricular systolic function  is normal. The right ventricular  size is normal.   3. Left atrial size was mildly dilated.   4. The mitral valve is normal in structure. Mild mitral valve  regurgitation.   5. The aortic valve is normal in structure. Aortic valve regurgitation is not visualized. No aortic stenosis is present.   Comparison(s): 07/21/20 EF 30-35%.   Echo 07/21/2020:  1. Global hypokinesis with akinesis of the inferolateral wall;  overall  moderate to severe LV dysfunction.   2. Left ventricular ejection fraction, by estimation, is 30 to 35%. The  left ventricle has moderate to severely decreased function. The left  ventricle demonstrates regional wall motion abnormalities (see scoring  diagram/findings for description). The  left ventricular internal cavity size was mildly dilated. Left ventricular  diastolic parameters are consistent with Grade I diastolic dysfunction  (impaired relaxation). Elevated left atrial pressure.   3. Right ventricular systolic function is normal. The right ventricular  size is normal.   4. Left atrial size was mildly dilated.   5. The mitral valve is normal in structure. Mild mitral valve  regurgitation. No evidence of mitral stenosis.   6. The aortic valve is tricuspid. Aortic valve regurgitation is not  visualized. No aortic stenosis is present.   7. The inferior vena cava is normal in size with greater than 50%  respiratory variability, suggesting right atrial pressure of 3 mmHg.   Echo 03/05/2020:  1. Left ventricular ejection fraction, by estimation, is 30 to 35%. The  left ventricle has moderately decreased function. The left ventricle  demonstrates global hypokinesis. Left ventricular diastolic parameters are  consistent with Grade I diastolic  dysfunction (impaired relaxation).   2. Right ventricular systolic function is normal. The right ventricular  size is normal.   3. The mitral valve is normal in structure. Mild mitral valve  regurgitation. No evidence of mitral stenosis.   4. The aortic valve is normal in structure. Aortic valve regurgitation is  not visualized. No aortic stenosis is present.   5. The inferior vena cava is normal in size with greater than 50%  respiratory variability, suggesting right atrial pressure of 3 mmHg.  LHC 03/05/2020: Right dominant coronary anatomy Widely patent coronary arteries.   Coronary tortuosity is noted in all 3 territories.   The LAD contains 2 acute vessel angulation/kinks in the mid and distal territory..  No obstructive disease is noted. Poor quality left ventriculogram demonstrates anteroapical and inferoapical hypokinesis.  Anterobasal inferobasal contractility is hyperdynamic.  LVEDP is 13 mmHg.  Clinical presentation and cath data suggests probable Takotsubo syndrome.  INOCA / MINOCA seem less likely.   RECOMMENDATIONS:   Conservative management Coated aspirin 81 mg daily  EKG:  EKG is personally reviewed.   11/16/2020: NSR at 60 bpm  Recent Labs: 03/04/2020: ALT 20 03/05/2020: Hemoglobin 11.8; Platelets 304 07/30/2020: BUN 20; Creatinine, Ser 0.87; Potassium 4.4; Sodium 137   Recent Lipid Panel    Component Value Date/Time   CHOL 189 03/05/2020 0025   TRIG 42 03/05/2020 0025   HDL 71 03/05/2020 0025   CHOLHDL 2.7 03/05/2020 0025   VLDL 8 03/05/2020 0025   LDLCALC 110 (H) 03/05/2020 0025    Physical Exam:    VS:  BP 110/70   Pulse 60   Ht 5\' 5"  (1.651 m)   Wt 128 lb 4.8 oz (58.2 kg)   SpO2 96%   BMI 21.35 kg/m     Wt Readings from Last 3 Encounters:  11/16/20 128 lb 4.8 oz (58.2  kg)  08/20/20 130 lb (59 kg)  07/22/20 131 lb (59.4 kg)    GEN: Well nourished, well developed in no acute distress HEENT: Normal, moist mucous membranes NECK: No JVD CARDIAC: regular rhythm, normal S1 and S2, no rubs or gallops. No murmur. VASCULAR: Radial and DP pulses 2+ bilaterally. No carotid bruits RESPIRATORY:  Clear to auscultation without rales, wheezing or rhonchi  ABDOMEN: Soft, non-tender, non-distended MUSCULOSKELETAL:  Ambulates independently SKIN: Warm and dry, no edema NEUROLOGIC:  Alert and oriented x 3. No focal neuro deficits noted. PSYCHIATRIC:  Normal affect    ASSESSMENT:    1. Takotsubo cardiomyopathy   2. Primary hypertension   3. Cardiac risk counseling   4. Counseling on health promotion and disease prevention    PLAN:    Takostubo cardiomyopathy History of  hypertension, now with borderline low pressures -recent echo read as improved EF, 50-55%. -continue metoprolol -with lightheadedness, borderline low blood pressures, and improvement in EF, discussed continuing entresto vs. Returning to valsartan alone. She felt much better on valsartan. After risk/benefit and shared decision making, will stop entresto and restart valsartan 40 mg daily -counseled on red flag warning signs that need immediate medical attention  Cardiac risk counseling and prevention recommendations: -recommend heart healthy/Mediterranean diet, with whole grains, fruits, vegetable, fish, lean meats, nuts, and olive oil. Limit salt. -recommend moderate walking, 3-5 times/week for 30-50 minutes each session. Aim for at least 150 minutes.week. Goal should be pace of 3 miles/hours, or walking 1.5 miles in 30 minutes -recommend avoidance of tobacco products. Avoid excess alcohol.  Plan for follow up: 6 months or sooner as needed.  Jodelle RedBridgette Jalaiyah Throgmorton, MD, PhD, [facility] [place]  [facility]    Medication Adjustments/Labs and Tests Ordered: Current medicines are reviewed at length with the patient today.  Concerns regarding medicines are outlined above.   Orders Placed This Encounter  Procedures   EKG 12-Lead    Meds ordered this encounter  Medications   valsartan (DIOVAN) 40 MG tablet    Sig: Take 1 tablet (40 mg total) by mouth daily.    Dispense:  90 tablet    Refill:  3    Replaces entresto    Patient Instructions  Medication Instructions:  STOP ENTRESTO   START VALSARTAN 40 MG DAILY   *If you need a refill on your cardiac medications before your next appointment, please call your pharmacy*  Lab Work: NONE  Testing/Procedures: NONE  Follow-Up: [facility], you and your health needs are our priority.  As part of our continuing mission to provide you with exceptional heart care, we have created designated Provider Care Teams.  These Care Teams  include your primary Cardiologist (physician) and Advanced Practice Providers (APPs -  Physician Assistants and Nurse Practitioners) who all work together to provide you with the care you need, when you need it.  We recommend signing up for the patient portal called "MyChart".  Sign up information is provided on this After Visit Summary.  MyChart is used to connect with patients for Virtual Visits (Telemedicine).  Patients are able to view lab/test results, encounter notes, upcoming appointments, etc.  Non-urgent messages can be sent to your provider as well.   To learn more about what you can do with MyChart, go to [email or web address].    Your next appointment:   6 month(s)  The format for your next appointment:   In Person  Provider:   Jodelle RedBridgette Sherrine Salberg, MD       [facility],Mathew Stumpf,acting as  a scribe for Jodelle Red, MD.,have documented all relevant documentation on the behalf of Jodelle Red, MD,as directed by  Jodelle Red, MD while in the presence of Jodelle Red, MD.   I, Jodelle Red, MD, have reviewed all documentation for this visit. The documentation on 11/16/20 for the exam, diagnosis, procedures, and orders are all accurate and complete.   Signed, Jodelle Red, MD PhD 11/16/2020 1:26 PM    [facility]

## 2020-12-31 ENCOUNTER — Other Ambulatory Visit (HOSPITAL_BASED_OUTPATIENT_CLINIC_OR_DEPARTMENT_OTHER): Payer: Self-pay

## 2020-12-31 ENCOUNTER — Ambulatory Visit: Payer: Medicare Other | Attending: Internal Medicine

## 2020-12-31 DIAGNOSIS — Z23 Encounter for immunization: Secondary | ICD-10-CM

## 2020-12-31 MED ORDER — INFLUENZA VAC A&B SA ADJ QUAD 0.5 ML IM PRSY
PREFILLED_SYRINGE | INTRAMUSCULAR | 0 refills | Status: DC
  Filled 2020-12-31: qty 0.5, 1d supply, fill #0

## 2020-12-31 NOTE — Progress Notes (Signed)
   Covid-19 Vaccination Clinic  Name:  Amarii Amy    MRN: [id number] DOB: Aug 04, 1950  12/31/2020  Ms. Pichette was observed post Covid-19 immunization for 15 minutes without incident. She was provided with Vaccine Information Sheet and instruction to access the V-Safe system.   Ms. Jauregui was instructed to call 911 with any severe reactions post vaccine: Difficulty breathing  Swelling of face and throat  A fast heartbeat  A bad rash all over body  Dizziness and weakness   Immunizations Administered     Name Date Dose VIS Date Route   Moderna Covid-19 vaccine Bivalent Booster 12/31/2020  9:34 AM 0.5 mL 10/17/2020 Intramuscular   Manufacturer: Moderna   Lot: [id number]   NDC: 14782-956-21

## 2021-01-13 ENCOUNTER — Telehealth: Payer: Self-pay | Admitting: Cardiology

## 2021-01-13 ENCOUNTER — Encounter (HOSPITAL_BASED_OUTPATIENT_CLINIC_OR_DEPARTMENT_OTHER): Payer: Self-pay

## 2021-01-13 DIAGNOSIS — I5181 Takotsubo syndrome: Secondary | ICD-10-CM

## 2021-01-13 NOTE — Telephone Encounter (Signed)
Returned call to pt. She state at 9/12 visit, Dr. Cristal Deer restarted her on valsartan but she didn't know she was only suppose to take 40 mg daily. She report previously she took 40 mg BID and didn't pay attention to new prescription label until it was time for a refill, and the pharmacy told her it was too soon. Pt state with taking 40 mg BID her BP has been averaging around 105/58.  Will forward to MD for recommendations.

## 2021-01-13 NOTE — Telephone Encounter (Signed)
Patient has question about her valsartan (DIOVAN) 40 MG tablet she was taking two tablets a day instead of one day.  She states it wasn't discussed in her last visit if Dr. Cristal Deer wanted her to change it.  She only noticed it now when she went to pharmacy for a refill and they told her it was too soon for a refill.   She is almost out of her valsartan, she will need a script sent to the pharmacy to hold her over till her next refill.

## 2021-01-14 MED ORDER — VALSARTAN 40 MG PO TABS: 40.0000 mg | ORAL_TABLET | Freq: Every day | ORAL | 3 refills | Status: DC

## 2021-01-14 NOTE — Telephone Encounter (Signed)
Pt updated with MD's recommendations and verbalized understanding.  

## 2021-01-14 NOTE — Telephone Encounter (Signed)
Please see updated phone encounter  

## 2021-01-14 NOTE — Telephone Encounter (Signed)
I would try taking it just daily and seeing what her blood pressures are. They are a bit low (not dangerous) and I'd like to see if taking just daily brings them more into the ~110s-120s on the top number. She can take some readings at home and let us know. If it is consistently more than 130 on the top number then we can make some adjustments. Thanks.

## 2021-01-25 ENCOUNTER — Other Ambulatory Visit (HOSPITAL_BASED_OUTPATIENT_CLINIC_OR_DEPARTMENT_OTHER): Payer: Self-pay

## 2021-01-25 MED ORDER — MODERNA COVID-19 BIVAL BOOSTER 50 MCG/0.5ML IM SUSP
INTRAMUSCULAR | 0 refills | Status: DC
  Filled 2021-01-25: qty 0.5, 1d supply, fill #0

## 2021-05-09 ENCOUNTER — Encounter (HOSPITAL_BASED_OUTPATIENT_CLINIC_OR_DEPARTMENT_OTHER): Payer: Self-pay

## 2021-05-17 ENCOUNTER — Encounter (HOSPITAL_BASED_OUTPATIENT_CLINIC_OR_DEPARTMENT_OTHER): Payer: Self-pay | Admitting: Cardiology

## 2021-05-17 ENCOUNTER — Ambulatory Visit (INDEPENDENT_AMBULATORY_CARE_PROVIDER_SITE_OTHER): Payer: Medicare Other | Admitting: Cardiology

## 2021-05-17 ENCOUNTER — Other Ambulatory Visit: Payer: Self-pay

## 2021-05-17 VITALS — BP 120/66 | HR 59 | Ht 65.0 in | Wt 127.4 lb

## 2021-05-17 DIAGNOSIS — R0789 Other chest pain: Secondary | ICD-10-CM

## 2021-05-17 DIAGNOSIS — I5181 Takotsubo syndrome: Secondary | ICD-10-CM

## 2021-05-17 DIAGNOSIS — I10 Essential (primary) hypertension: Secondary | ICD-10-CM

## 2021-05-17 DIAGNOSIS — Z7189 Other specified counseling: Secondary | ICD-10-CM | POA: Diagnosis not present

## 2021-05-17 NOTE — Progress Notes (Signed)
Cardiology Office Note:    Date:  05/17/2021   ID:  Veronica Hodges, DOB 1951-02-17, MRN [id number]  PCP:  Pcp, No  Cardiologist:  Jodelle Red, MD  CC: follow up  History of Present Illness:    Veronica Hodges is a 71 y.o. female with a hx of takotsubo cardiomyopathy, hypertension, and GERD seen for follow up today.  Today: She is doing well. However, she has been stressed recently. Her husband underwent R hip replacement last month and she is taking care of him. They were unable to have him approved for home physical therapy.   She endorses occasional chest pain that lasts a few minutes. The episodes occur a few times a week. Stress will worsen the chest pain.   Regularly, she records her blood pressure at home. On average, her blood pressure is about 119 systolic over 60s diastolic. The lowest she has seen her heart rate is 50s bpm.   She started taking magnesium supplements which has been improving her sleep quality.   Due to caring for her husband, she has been unable to exercise. Usually, for activity, she enjoys playing pickleball and walking.   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   Denies shortness of breath at rest or with normal exertion. No PND, orthopnea, LE edema or unexpected weight gain. No syncope or palpitations.  Past Medical History:  Diagnosis Date   Arthritis    GERD (gastroesophageal reflux disease)     Past Surgical History:  Procedure Laterality Date   LEFT HEART CATH AND CORONARY ANGIOGRAPHY N/A 03/05/2020   Procedure: LEFT HEART CATH AND CORONARY ANGIOGRAPHY;  Surgeon: Lyn Records, MD;  Location: [facility];  Service: Cardiovascular;  Laterality: N/A;   TONSILLECTOMY      Current Medications: Current Outpatient Medications on File Prior to Visit  Medication Sig   calcium carbonate (TUMS - DOSED IN MG ELEMENTAL CALCIUM) 500 MG chewable tablet Chew 2 tablets by mouth every evening.   COVID-19 mRNA  bivalent vaccine, Moderna, (MODERNA COVID-19 BIVAL BOOSTER) 50 MCG/0.5ML injection Inject into the muscle.   COVID-19 mRNA vaccine, Moderna, 100 MCG/0.5ML injection Inject into the muscle.   diphenhydrAMINE (BENADRYL) 25 MG tablet Take 25 mg by mouth every 6 (six) hours as needed.   influenza vaccine adjuvanted (FLUAD) 0.5 ML injection Inject into the muscle.   Magnesium (HIGH ABSORPTION MAGNESIUM) 100 MG TABS    metoprolol succinate (TOPROL-XL) 50 MG 24 hr tablet Take 1 tablet (50 mg total) by mouth daily. Take with or immediately following a meal.   Multiple Vitamin (MULTIVITAMIN ADULT) TABS Take 1 tablet by mouth every evening.   valsartan (DIOVAN) 40 MG tablet Take 1 tablet (40 mg total) by mouth daily.   No current facility-administered medications on file prior to visit.     Allergies:   Patient has no known allergies.   Social History   Tobacco Use   Smoking status: Former   Smokeless tobacco: Never  Building services engineer Use: Never used  Substance Use Topics   Alcohol use: Yes    Comment: wine with dinner   Drug use: Never    Family History: family history includes Hypertension in her father.  ROS:   Please see the history of present illness. (+) Stress (+) Chest pain All other systems are reviewed and negative.   EKGs/Labs/Other Studies Reviewed:    The following studies were reviewed today: Echo 09/18/2020: 1. Left ventricular ejection  fraction, by estimation, is 50 to 55%. The  left ventricle has low normal function. The left ventricle has no regional wall motion abnormalities. The left ventricular internal cavity size was mildly dilated. Left ventricular diastolic parameters are consistent with Grade I diastolic dysfunction (impaired relaxation). Elevated left ventricular end-diastolic pressure.  The average left ventricular global longitudinal strain is -21.7 %. The  global longitudinal strain is normal.   2. Right ventricular systolic function is normal. The  right ventricular  size is normal.   3. Left atrial size was mildly dilated.   4. The mitral valve is normal in structure. Mild mitral valve  regurgitation.   5. The aortic valve is normal in structure. Aortic valve regurgitation is not visualized. No aortic stenosis is present.   Comparison(s): 07/21/20 EF 30-35%.   Echo 07/21/2020:  1. Global hypokinesis with akinesis of the inferolateral wall; overall  moderate to severe LV dysfunction.   2. Left ventricular ejection fraction, by estimation, is 30 to 35%. The  left ventricle has moderate to severely decreased function. The left  ventricle demonstrates regional wall motion abnormalities (see scoring  diagram/findings for description). The  left ventricular internal cavity size was mildly dilated. Left ventricular  diastolic parameters are consistent with Grade I diastolic dysfunction  (impaired relaxation). Elevated left atrial pressure.   3. Right ventricular systolic function is normal. The right ventricular  size is normal.   4. Left atrial size was mildly dilated.   5. The mitral valve is normal in structure. Mild mitral valve  regurgitation. No evidence of mitral stenosis.   6. The aortic valve is tricuspid. Aortic valve regurgitation is not  visualized. No aortic stenosis is present.   7. The inferior vena cava is normal in size with greater than 50%  respiratory variability, suggesting right atrial pressure of 3 mmHg.   Echo 03/05/2020:  1. Left ventricular ejection fraction, by estimation, is 30 to 35%. The  left ventricle has moderately decreased function. The left ventricle  demonstrates global hypokinesis. Left ventricular diastolic parameters are  consistent with Grade I diastolic  dysfunction (impaired relaxation).   2. Right ventricular systolic function is normal. The right ventricular  size is normal.   3. The mitral valve is normal in structure. Mild mitral valve  regurgitation. No evidence of mitral stenosis.    4. The aortic valve is normal in structure. Aortic valve regurgitation is  not visualized. No aortic stenosis is present.   5. The inferior vena cava is normal in size with greater than 50%  respiratory variability, suggesting right atrial pressure of 3 mmHg.  LHC 03/05/2020: Right dominant coronary anatomy Widely patent coronary arteries.   Coronary tortuosity is noted in all 3 territories.  The LAD contains 2 acute vessel angulation/kinks in the mid and distal territory..  No obstructive disease is noted. Poor quality left ventriculogram demonstrates anteroapical and inferoapical hypokinesis.  Anterobasal inferobasal contractility is hyperdynamic.  LVEDP is 13 mmHg.  Clinical presentation and cath data suggests probable Takotsubo syndrome.  INOCA / MINOCA seem less likely.   RECOMMENDATIONS:   Conservative management Coated aspirin 81 mg daily  EKG:  EKG personally reviewed today 05/17/21: ECG was not ordered today.   11/16/2020: NSR at 60 bpm  Recent Labs: 07/30/2020: BUN 20; Creatinine, Ser 0.87; Potassium 4.4; Sodium 137   Recent Lipid Panel    Component Value Date/Time   CHOL 189 03/05/2020 0025   TRIG 42 03/05/2020 0025   HDL 71 03/05/2020 0025   CHOLHDL  2.7 03/05/2020 0025   VLDL 8 03/05/2020 0025   LDLCALC 110 (H) 03/05/2020 0025    Physical Exam:    VS:  BP 120/66 (BP Location: Other (Comment)) Comment (BP Location): home blood pressure   Pulse (!) 59    Ht 5\' 5"  (1.651 m)    Wt 127 lb 6.4 oz (57.8 kg)    SpO2 98%    BMI 21.20 kg/m     Wt Readings from Last 3 Encounters:  05/17/21 127 lb 6.4 oz (57.8 kg)  11/16/20 128 lb 4.8 oz (58.2 kg)  08/20/20 130 lb (59 kg)    GEN: Well nourished, well developed in no acute distress HEENT: Normal, moist mucous membranes NECK: No JVD CARDIAC: regular rhythm, normal S1 and S2, no rubs or gallops. No murmur. VASCULAR: Radial and DP pulses 2+ bilaterally. No carotid bruits RESPIRATORY:  Clear to auscultation without rales,  wheezing or rhonchi  ABDOMEN: Soft, non-tender, non-distended MUSCULOSKELETAL:  Ambulates independently SKIN: Warm and dry, no edema NEUROLOGIC:  Alert and oriented x 3. No focal neuro deficits noted. PSYCHIATRIC:  Normal affect    ASSESSMENT:    1. Takotsubo cardiomyopathy   2. Primary hypertension   3. Cardiac risk counseling   4. Counseling on health promotion and disease prevention   5. Atypical chest pain     PLAN:    Takostubo cardiomyopathy Hypertension -echo read as improved EF, 50-55%. -continue metoprolol -continue valsartan 40 mg daily. Had lightheadedness with entresto -counseled on red flag warning signs that need immediate medical attention  Chest pain: widely patent coronaries on cath. No evidence of CAD. Discussed noncardiac causes of chest pain, when to see immediate medical attention  Cardiac risk counseling and prevention recommendations: -recommend heart healthy/Mediterranean diet, with whole grains, fruits, vegetable, fish, lean meats, nuts, and olive oil. Limit salt. -recommend moderate walking, 3-5 times/week for 30-50 minutes each session. Aim for at least 150 minutes.week. Goal should be pace of 3 miles/hours, or walking 1.5 miles in 30 minutes -recommend avoidance of tobacco products. Avoid excess alcohol.  Plan for follow up: 1 year or sooner as needed.  Jodelle RedBridgette Angelicia Lessner, MD, PhD, [facility] [place]   [facility]    Medication Adjustments/Labs and Tests Ordered: Current medicines are reviewed at length with the patient today.  Concerns regarding medicines are outlined above.   No orders of the defined types were placed in this encounter.   No orders of the defined types were placed in this encounter.   Patient Instructions  Medication Instructions:  Your Physician recommend you continue on your current medication as directed.    *If you need a refill on your cardiac medications before your next appointment, please call your  pharmacy*   Lab Work: None ordered today   Testing/Procedures: None ordered today   Follow-Up: At [facility], you and your health needs are our priority.  As part of our continuing mission to provide you with exceptional heart care, we have created designated Provider Care Teams.  These Care Teams include your primary Cardiologist (physician) and Advanced Practice Providers (APPs -  Physician Assistants and Nurse Practitioners) who all work together to provide you with the care you need, when you need it.  We recommend signing up for the patient portal called "MyChart".  Sign up information is provided on this After Visit Summary.  MyChart is used to connect with patients for Virtual Visits (Telemedicine).  Patients are able to view lab/test results, encounter notes, upcoming appointments, etc.  Non-urgent  messages can be sent to your provider as well.   To learn more about what you can do with MyChart, go to [email or web address].    Your next appointment:   1 year(s)  The format for your next appointment:   In Person  Provider:   Jodelle Red, MD{    Sherin Quarry as a scribe for Jodelle Red, MD.,have documented all relevant documentation on the behalf of Jodelle Red, MD,as directed by  Jodelle Red, MD while in the presence of Jodelle Red, MD.  I, Jodelle Red, MD, have reviewed all documentation for this visit. The documentation on 05/17/21 for the exam, diagnosis, procedures, and orders are all accurate and complete.   Signed, Jodelle Red, MD PhD 05/17/2021 11:46 AM    [facility]

## 2021-05-17 NOTE — Patient Instructions (Signed)

## 2021-06-21 ENCOUNTER — Other Ambulatory Visit: Payer: Self-pay | Admitting: Internal Medicine

## 2021-06-22 NOTE — Telephone Encounter (Signed)
Rx(s) sent to pharmacy electronically.  

## 2021-07-09 ENCOUNTER — Ambulatory Visit: Payer: Medicare Other | Attending: Internal Medicine

## 2021-07-09 DIAGNOSIS — Z23 Encounter for immunization: Secondary | ICD-10-CM

## 2021-07-12 ENCOUNTER — Other Ambulatory Visit (HOSPITAL_BASED_OUTPATIENT_CLINIC_OR_DEPARTMENT_OTHER): Payer: Self-pay

## 2021-07-12 MED ORDER — MODERNA COVID-19 BIVAL BOOSTER 50 MCG/0.5ML IM SUSP
INTRAMUSCULAR | 0 refills | Status: DC
  Filled 2021-07-12: qty 0.5, 1d supply, fill #0

## 2021-07-12 NOTE — Progress Notes (Signed)
? ?  [facility] ? ?Name:  Veronica Hodges    ?MRN: [id number] ?DOB: 1950/06/03 ? ?07/12/2021 ? ?Veronica Hodges was observed post Covid-19 immunization for 15 minutes without incident. She was provided with Vaccine Information Sheet and instruction to access the V-Safe system.  ? ?Veronica Hodges was instructed to call 911 with any severe reactions post vaccine: ?Difficulty breathing  ?Swelling of face and throat  ?A fast heartbeat  ?A bad rash all over body  ?Dizziness and weakness  ? ?Immunizations Administered   ? ? Name Date Dose VIS Date Route  ? Moderna Covid-19 vaccine Bivalent Booster 07/09/2021 11:56 AM 0.5 mL 10/17/2020 Intramuscular  ? Manufacturer: Moderna  ? Lot: [id number]  ? NDC: 80777-282-99  ? ?  ? ? ? ?

## 2021-12-29 ENCOUNTER — Other Ambulatory Visit (HOSPITAL_BASED_OUTPATIENT_CLINIC_OR_DEPARTMENT_OTHER): Payer: Self-pay

## 2021-12-29 MED ORDER — COMIRNATY 30 MCG/0.3ML IM SUSY
PREFILLED_SYRINGE | INTRAMUSCULAR | 0 refills | Status: DC
  Filled 2021-12-29: qty 0.3, 1d supply, fill #0

## 2021-12-29 MED ORDER — INFLUENZA VAC A&B SA ADJ QUAD 0.5 ML IM PRSY
PREFILLED_SYRINGE | INTRAMUSCULAR | 0 refills | Status: DC
  Filled 2021-12-29: qty 0.5, 1d supply, fill #0

## 2022-01-10 ENCOUNTER — Other Ambulatory Visit: Payer: Self-pay | Admitting: Cardiology

## 2022-01-10 DIAGNOSIS — I5181 Takotsubo syndrome: Secondary | ICD-10-CM

## 2022-05-13 NOTE — Progress Notes (Signed)
Cardiology Office Note:    Date:  05/16/2022   ID:  Veronica Hodges, DOB [birth year], MRN [id number]  PCP:  Pcp, No  Cardiologist:  Buford Dresser, MD  CC: follow up  History of Present Illness:    Veronica Hodges is a 72 y.o. female with a hx of takotsubo cardiomyopathy, hypertension, and GERD seen for follow up today.  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 She complained of occasional chest pain exacerbated by stress. Her home blood pressures averaged 119/60s. 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 Discussed noncardiac causes of chest pain, when to seek immediate medical attention.  Today, she complains of fairly regular swelling especially by the end of the day (R>L). She often notes the presence of sock lines. This is always improved by the next morning and has been ongoing for the past few months. She denies significant shortness of breath. At home she frequently walks up and down her stairs comfortably. She also goes walking around her neighborhood or at the park for up to 1.5 hours.  Sometimes she will experience a "fluttery" feeling in her chest. Additionally she complains of "little bits" of chest discomfort. This is enough for her to be conscious of her chest discomfort, but not painful. No passing out or blacking out.  Recently she admits to eating more junk foods. She notes that this started with the holidays and her birthday was last month.  Also she complains of worsening arthritis, especially in her hands. She notices that her hands and feet feel colder than they did previously. She complains of nocturnal leg and foot cramps bilaterally. She has been trying a magnesium supplement for treatment.  She believes she is tolerating her medications.  She denies any lightheadedness, headaches, syncope, orthopnea, or PND.   Past Medical History:  Diagnosis  Date   Arthritis    GERD (gastroesophageal reflux disease)     Past Surgical History:  Procedure Laterality Date   LEFT HEART CATH AND CORONARY ANGIOGRAPHY N/A 03/05/2020   Procedure: LEFT HEART CATH AND CORONARY ANGIOGRAPHY;  Surgeon: Belva Crome, MD;  Location: [facility];  Service: Cardiovascular;  Laterality: N/A;   TONSILLECTOMY      Current Medications: Current Outpatient Medications on File Prior to Visit  Medication Sig   calcium carbonate (TUMS - DOSED IN MG ELEMENTAL CALCIUM) 500 MG chewable tablet Chew 2 tablets by mouth every evening.   COVID-19 mRNA bivalent vaccine, Moderna, (MODERNA COVID-19 BIVAL BOOSTER) 50 MCG/0.5ML injection Inject into the muscle.   COVID-19 mRNA bivalent vaccine, Moderna, (MODERNA COVID-19 BIVAL BOOSTER) 50 MCG/0.5ML injection Inject into the muscle.   COVID-19 mRNA vaccine 2023-2024 (COMIRNATY) syringe Inject into the muscle.   COVID-19 mRNA vaccine, Moderna, 100 MCG/0.5ML injection Inject into the muscle.   diphenhydrAMINE (BENADRYL) 25 MG tablet Take 25 mg by mouth every 6 (six) hours as needed.   influenza vaccine adjuvanted (FLUAD) 0.5 ML injection Inject into the muscle.   influenza vaccine adjuvanted (FLUAD) 0.5 ML injection Inject into the muscle.   Magnesium (HIGH ABSORPTION MAGNESIUM) 100 MG TABS    metoprolol succinate (TOPROL-XL) 50 MG 24 hr tablet TAKE 1 TABLET(50 MG) BY MOUTH DAILY WITH OR IMMEDIATELY FOLLOWING A MEAL   Multiple Vitamin (MULTIVITAMIN ADULT) TABS Take 1 tablet by mouth every evening.   valsartan (DIOVAN) 40 MG tablet TAKE 1 TABLET(40 MG) BY MOUTH DAILY  No current facility-administered medications on file prior to visit.     Allergies:   Patient has no known allergies.   Social History   Tobacco Use   Smoking status: Former   Smokeless tobacco: Never  Scientific laboratory technician Use: Never used  Substance Use Topics   Alcohol use: Yes    Comment: wine with dinner   Drug use: Never    Family  History: family history includes Hypertension in her father.  ROS:   Please see the history of present illness. (+) BLE edema (+) Palpitations (+) Chest discomfort (+) Arthralgias (+) Coldness of hands and feet (+) Nocturnal LE muscle cramps All other systems are reviewed and negative.   EKGs/Labs/Other Studies Reviewed:    The following studies were reviewed today:  Echo 09/18/2020: 1. Left ventricular ejection fraction, by estimation, is 50 to 55%. The  left ventricle has low normal function. The left ventricle has no regional wall motion abnormalities. The left ventricular internal cavity size was mildly dilated. Left ventricular diastolic parameters are consistent with Grade I diastolic dysfunction (impaired relaxation). Elevated left ventricular end-diastolic pressure.  The average left ventricular global longitudinal strain is -21.7 %. The  global longitudinal strain is normal.   2. Right ventricular systolic function is normal. The right ventricular  size is normal.   3. Left atrial size was mildly dilated.   4. The mitral valve is normal in structure. Mild mitral valve  regurgitation.   5. The aortic valve is normal in structure. Aortic valve regurgitation is not visualized. No aortic stenosis is present.   Comparison(s): 07/21/20 EF 30-35%.   Echo 07/21/2020:  1. Global hypokinesis with akinesis of the inferolateral wall; overall  moderate to severe LV dysfunction.   2. Left ventricular ejection fraction, by estimation, is 30 to 35%. The  left ventricle has moderate to severely decreased function. The left  ventricle demonstrates regional wall motion abnormalities (see scoring  diagram/findings for description). The  left ventricular internal cavity size was mildly dilated. Left ventricular  diastolic parameters are consistent with Grade I diastolic dysfunction  (impaired relaxation). Elevated left atrial pressure.   3. Right ventricular systolic function is normal. The  right ventricular  size is normal.   4. Left atrial size was mildly dilated.   5. The mitral valve is normal in structure. Mild mitral valve  regurgitation. No evidence of mitral stenosis.   6. The aortic valve is tricuspid. Aortic valve regurgitation is not  visualized. No aortic stenosis is present.   7. The inferior vena cava is normal in size with greater than 50%  respiratory variability, suggesting right atrial pressure of 3 mmHg.   Echo 03/05/2020:  1. Left ventricular ejection fraction, by estimation, is 30 to 35%. The  left ventricle has moderately decreased function. The left ventricle  demonstrates global hypokinesis. Left ventricular diastolic parameters are  consistent with Grade I diastolic  dysfunction (impaired relaxation).   2. Right ventricular systolic function is normal. The right ventricular  size is normal.   3. The mitral valve is normal in structure. Mild mitral valve  regurgitation. No evidence of mitral stenosis.   4. The aortic valve is normal in structure. Aortic valve regurgitation is  not visualized. No aortic stenosis is present.   5. The inferior vena cava is normal in size with greater than 50%  respiratory variability, suggesting right atrial pressure of 3 mmHg.  LHC 03/05/2020: Right dominant coronary anatomy Widely patent coronary arteries.   Coronary  tortuosity is noted in all 3 territories.  The LAD contains 2 acute vessel angulation/kinks in the mid and distal territory..  No obstructive disease is noted. Poor quality left ventriculogram demonstrates anteroapical and inferoapical hypokinesis.  Anterobasal inferobasal contractility is hyperdynamic.  LVEDP is 13 mmHg.  Clinical presentation and cath data suggests probable Takotsubo syndrome.  INOCA / MINOCA seem less likely.   RECOMMENDATIONS:   Conservative management Coated aspirin 81 mg daily  EKG:  EKG personally reviewed today 05/16/2022:  NSR at 73 bpm, abnormal septal forces 05/17/21:  ECG was not ordered   11/16/2020: NSR at 60 bpm  Recent Labs: No results found for requested labs within last 365 days.   Recent Lipid Panel    Component Value Date/Time   CHOL 189 03/05/2020 0025   TRIG 42 03/05/2020 0025   HDL 71 03/05/2020 0025   CHOLHDL 2.7 03/05/2020 0025   VLDL 8 03/05/2020 0025   LDLCALC 110 (H) 03/05/2020 0025    Physical Exam:    VS:  BP 124/66   Pulse 73   Ht 5\' 5"  (1.651 m)   Wt 138 lb (62.6 kg)   BMI 22.96 kg/m     Wt Readings from Last 3 Encounters:  05/16/22 138 lb (62.6 kg)  05/17/21 127 lb 6.4 oz (57.8 kg)  11/16/20 128 lb 4.8 oz (58.2 kg)    GEN: Well nourished, well developed in no acute distress HEENT: Normal, moist mucous membranes NECK: No JVD CARDIAC: regular rhythm with occasional early beats, normal S1 and S2, no rubs or gallops. No murmur. VASCULAR: Radial and DP pulses 2+ bilaterally. No carotid bruits RESPIRATORY:  Clear to auscultation without rales, wheezing or rhonchi  ABDOMEN: Soft, non-tender, non-distended MUSCULOSKELETAL:  Ambulates independently SKIN: Warm and dry, no edema NEUROLOGIC:  Alert and oriented x 3. No focal neuro deficits noted. PSYCHIATRIC:  Normal affect    ASSESSMENT:    1. Takotsubo cardiomyopathy   2. Bilateral leg edema   3. Primary hypertension   4. Cardiac risk counseling   5. Counseling on health promotion and disease prevention     PLAN:    Takostubo cardiomyopathy Hypertension -echo read as improved EF, 50-55%. -continue metoprolol -continue valsartan 40 mg daily. Had lightheadedness with entresto -counseled on red flag warning signs that need immediate medical attention  Bilateral LE edema -labs today, cmet, tsh, cbc, lipids, bno -echo  Hypertension -at goal continue losartan  Chest pain: widely patent coronaries on cath. No evidence of CAD. Discussed noncardiac causes of chest pain, when to see immediate medical attention  Cardiac risk counseling and prevention  recommendations: -recommend heart healthy/Mediterranean diet, with whole grains, fruits, vegetable, fish, lean meats, nuts, and olive oil. Limit salt. -recommend moderate walking, 3-5 times/week for 30-50 minutes each session. Aim for at least 150 minutes.week. Goal should be pace of 3 miles/hours, or walking 1.5 miles in 30 minutes -recommend avoidance of tobacco products. Avoid excess alcohol.  Plan for follow up: 1 year or sooner as needed.  Buford Dresser, MD, PhD, [facility]    Medication Adjustments/Labs and Tests Ordered: Current medicines are reviewed at length with the patient today.  Concerns regarding medicines are outlined above.   Orders Placed This Encounter  Procedures   Comp Met (CMET)   TSH   CBC   Lipid panel   B Nat Peptide   EKG 12-Lead   ECHOCARDIOGRAM COMPLETE   No orders of the defined types were placed in this encounter.  Patient Instructions  Medication Instructions:  Your physician recommends that you continue on your current medications as directed. Please refer to the Current Medication list given to you today.  *If you need a refill on your cardiac medications before your next appointment, please call your pharmacy*  Lab Work: CMET/CBC/TSH/LP/BNP TODAY   If you have labs (blood work) drawn today and your tests are completely normal, you will receive your results only by: [place] (if you have MyChart) OR A paper copy in the mail If you have any lab test that is abnormal or we need to change your treatment, we will call you to review the results.  Testing/Procedures: Your physician has requested that you have an echocardiogram. Echocardiography is a painless test that uses sound waves to create images of your heart. It provides your doctor with information about the size and shape of your heart and how well your heart's chambers and valves are working. This procedure takes approximately one hour. There are no  restrictions for this procedure. Please do NOT wear cologne, perfume, aftershave, or lotions (deodorant is allowed). Please arrive 15 minutes prior to your appointment time.  Follow-Up: At [facility], you and your health needs are our priority.  As part of our continuing mission to provide you with exceptional heart care, we have created designated Provider Care Teams.  These Care Teams include your primary Cardiologist (physician) and Advanced Practice Providers (APPs -  Physician Assistants and Nurse Practitioners) who all work together to provide you with the care you need, when you need it.  We recommend signing up for the patient portal called "MyChart".  Sign up information is provided on this After Visit Summary.  MyChart is used to connect with patients for Virtual Visits (Telemedicine).  Patients are able to view lab/test results, encounter notes, upcoming appointments, etc.  Non-urgent messages can be sent to your provider as well.   To learn more about what you can do with MyChart, go to [email or web address].    Your next appointment:   12 month(s)  Provider:   Buford Dresser, MD    WILL SEE YOU SOONER IF ANYTHING CONCERNING SHOWS UP ON TESTING      I,Mathew Stumpf,acting as a scribe for Buford Dresser, MD.,have documented all relevant documentation on the behalf of Buford Dresser, MD,as directed by  Buford Dresser, MD while in the presence of Buford Dresser, MD.  I, Buford Dresser, MD, have reviewed all documentation for this visit. The documentation on 05/16/22 for the exam, diagnosis, procedures, and orders are all accurate and complete.   Signed, Buford Dresser, MD PhD 05/16/2022     [place]

## 2022-05-16 ENCOUNTER — Ambulatory Visit (INDEPENDENT_AMBULATORY_CARE_PROVIDER_SITE_OTHER): Payer: Medicare Other | Admitting: Cardiology

## 2022-05-16 ENCOUNTER — Encounter (HOSPITAL_BASED_OUTPATIENT_CLINIC_OR_DEPARTMENT_OTHER): Payer: Self-pay | Admitting: Cardiology

## 2022-05-16 VITALS — BP 124/66 | HR 73 | Ht 65.0 in | Wt 138.0 lb

## 2022-05-16 DIAGNOSIS — Z7189 Other specified counseling: Secondary | ICD-10-CM | POA: Diagnosis not present

## 2022-05-16 DIAGNOSIS — I10 Essential (primary) hypertension: Secondary | ICD-10-CM

## 2022-05-16 DIAGNOSIS — R600 Localized edema: Secondary | ICD-10-CM | POA: Diagnosis not present

## 2022-05-16 DIAGNOSIS — I5181 Takotsubo syndrome: Secondary | ICD-10-CM

## 2022-05-16 NOTE — Patient Instructions (Addendum)
Medication Instructions:  Your physician recommends that you continue on your current medications as directed. Please refer to the Current Medication list given to you today.  *If you need a refill on your cardiac medications before your next appointment, please call your pharmacy*  Lab Work: CMET/CBC/TSH/LP/BNP TODAY   If you have labs (blood work) drawn today and your tests are completely normal, you will receive your results only by: [place] (if you have MyChart) OR A paper copy in the mail If you have any lab test that is abnormal or we need to change your treatment, we will call you to review the results.  Testing/Procedures: Your physician has requested that you have an echocardiogram. Echocardiography is a painless test that uses sound waves to create images of your heart. It provides your doctor with information about the size and shape of your heart and how well your heart's chambers and valves are working. This procedure takes approximately one hour. There are no restrictions for this procedure. Please do NOT wear cologne, perfume, aftershave, or lotions (deodorant is allowed). Please arrive 15 minutes prior to your appointment time.  Follow-Up: At [facility], you and your health needs are our priority.  As part of our continuing mission to provide you with exceptional heart care, we have created designated Provider Care Teams.  These Care Teams include your primary Cardiologist (physician) and Advanced Practice Providers (APPs -  Physician Assistants and Nurse Practitioners) who all work together to provide you with the care you need, when you need it.  We recommend signing up for the patient portal called "MyChart".  Sign up information is provided on this After Visit Summary.  MyChart is used to connect with patients for Virtual Visits (Telemedicine).  Patients are able to view lab/test results, encounter notes, upcoming appointments, etc.  Non-urgent messages can  be sent to your provider as well.   To learn more about what you can do with MyChart, go to [email or web address].    Your next appointment:   12 month(s)  Provider:   Buford Dresser, MD    WILL SEE YOU SOONER IF ANYTHING CONCERNING SHOWS UP ON TESTING

## 2022-05-17 LAB — CBC
Hematocrit: 37.2 % (ref 34.0–46.6)
Hemoglobin: 12.8 g/dL (ref 11.1–15.9)
MCH: 32.5 pg (ref 26.6–33.0)
MCHC: 34.4 g/dL (ref 31.5–35.7)
MCV: 94 fL (ref 79–97)
Platelets: 321 10*3/uL (ref 150–450)
RBC: 3.94 x10E6/uL (ref 3.77–5.28)
RDW: 11.5 % — ABNORMAL LOW (ref 11.7–15.4)
WBC: 8.5 10*3/uL (ref 3.4–10.8)

## 2022-05-17 LAB — COMPREHENSIVE METABOLIC PANEL
ALT: 16 IU/L (ref 0–32)
AST: 22 IU/L (ref 0–40)
Albumin/Globulin Ratio: 1.9 (ref 1.2–2.2)
Albumin: 4.5 g/dL (ref 3.8–4.8)
Alkaline Phosphatase: 71 IU/L (ref 44–121)
BUN/Creatinine Ratio: 17 (ref 12–28)
BUN: 15 mg/dL (ref 8–27)
Bilirubin Total: 0.5 mg/dL (ref 0.0–1.2)
CO2: 23 mmol/L (ref 20–29)
Calcium: 9.7 mg/dL (ref 8.7–10.3)
Chloride: 100 mmol/L (ref 96–106)
Creatinine, Ser: 0.9 mg/dL (ref 0.57–1.00)
Globulin, Total: 2.4 g/dL (ref 1.5–4.5)
Glucose: 95 mg/dL (ref 70–99)
Potassium: 4.3 mmol/L (ref 3.5–5.2)
Sodium: 139 mmol/L (ref 134–144)
Total Protein: 6.9 g/dL (ref 6.0–8.5)
eGFR: 68 mL/min/{1.73_m2} (ref >59)

## 2022-05-17 LAB — LIPID PANEL
Chol/HDL Ratio: 3.1 ratio (ref 0.0–4.4)
Cholesterol, Total: 205 mg/dL — ABNORMAL HIGH (ref 100–199)
HDL: 67 mg/dL (ref >39)
LDL Chol Calc (NIH): 117 mg/dL — ABNORMAL HIGH (ref 0–99)
Triglycerides: 119 mg/dL (ref 0–149)
VLDL Cholesterol Cal: 21 mg/dL (ref 5–40)

## 2022-05-17 LAB — BRAIN NATRIURETIC PEPTIDE: BNP: 368 pg/mL — ABNORMAL HIGH (ref 0.0–100.0)

## 2022-05-17 LAB — TSH: TSH: 1.46 u[IU]/mL (ref 0.450–4.500)

## 2022-05-29 ENCOUNTER — Encounter (HOSPITAL_BASED_OUTPATIENT_CLINIC_OR_DEPARTMENT_OTHER): Payer: Self-pay | Admitting: Cardiology

## 2022-06-07 ENCOUNTER — Ambulatory Visit (INDEPENDENT_AMBULATORY_CARE_PROVIDER_SITE_OTHER): Payer: Medicare Other

## 2022-06-07 DIAGNOSIS — R600 Localized edema: Secondary | ICD-10-CM

## 2022-06-07 DIAGNOSIS — I5181 Takotsubo syndrome: Secondary | ICD-10-CM

## 2022-06-07 LAB — ECHOCARDIOGRAM COMPLETE
Area-P 1/2: 3.21 cm2
Calc EF: 51.4 %
MV M vel: 4.94 m/s
MV Peak grad: 97.6 mmHg
Radius: 0.7 cm
S' Lateral: 3.67 cm
Single Plane A2C EF: 58.1 %
Single Plane A4C EF: 43.3 %

## 2022-06-16 ENCOUNTER — Other Ambulatory Visit: Payer: Self-pay | Admitting: Cardiology

## 2022-06-16 NOTE — Telephone Encounter (Signed)
Rx request sent to pharmacy.  

## 2022-06-27 ENCOUNTER — Encounter (HOSPITAL_BASED_OUTPATIENT_CLINIC_OR_DEPARTMENT_OTHER): Payer: Self-pay

## 2022-08-28 IMAGING — DX DG CHEST 1V
1 series · 1 of 1 positions shown · non-contrast
Comparison: None.

CLINICAL DATA: Intermittent chest pain

EXAM:
CHEST  1 VIEW

[chest ap]
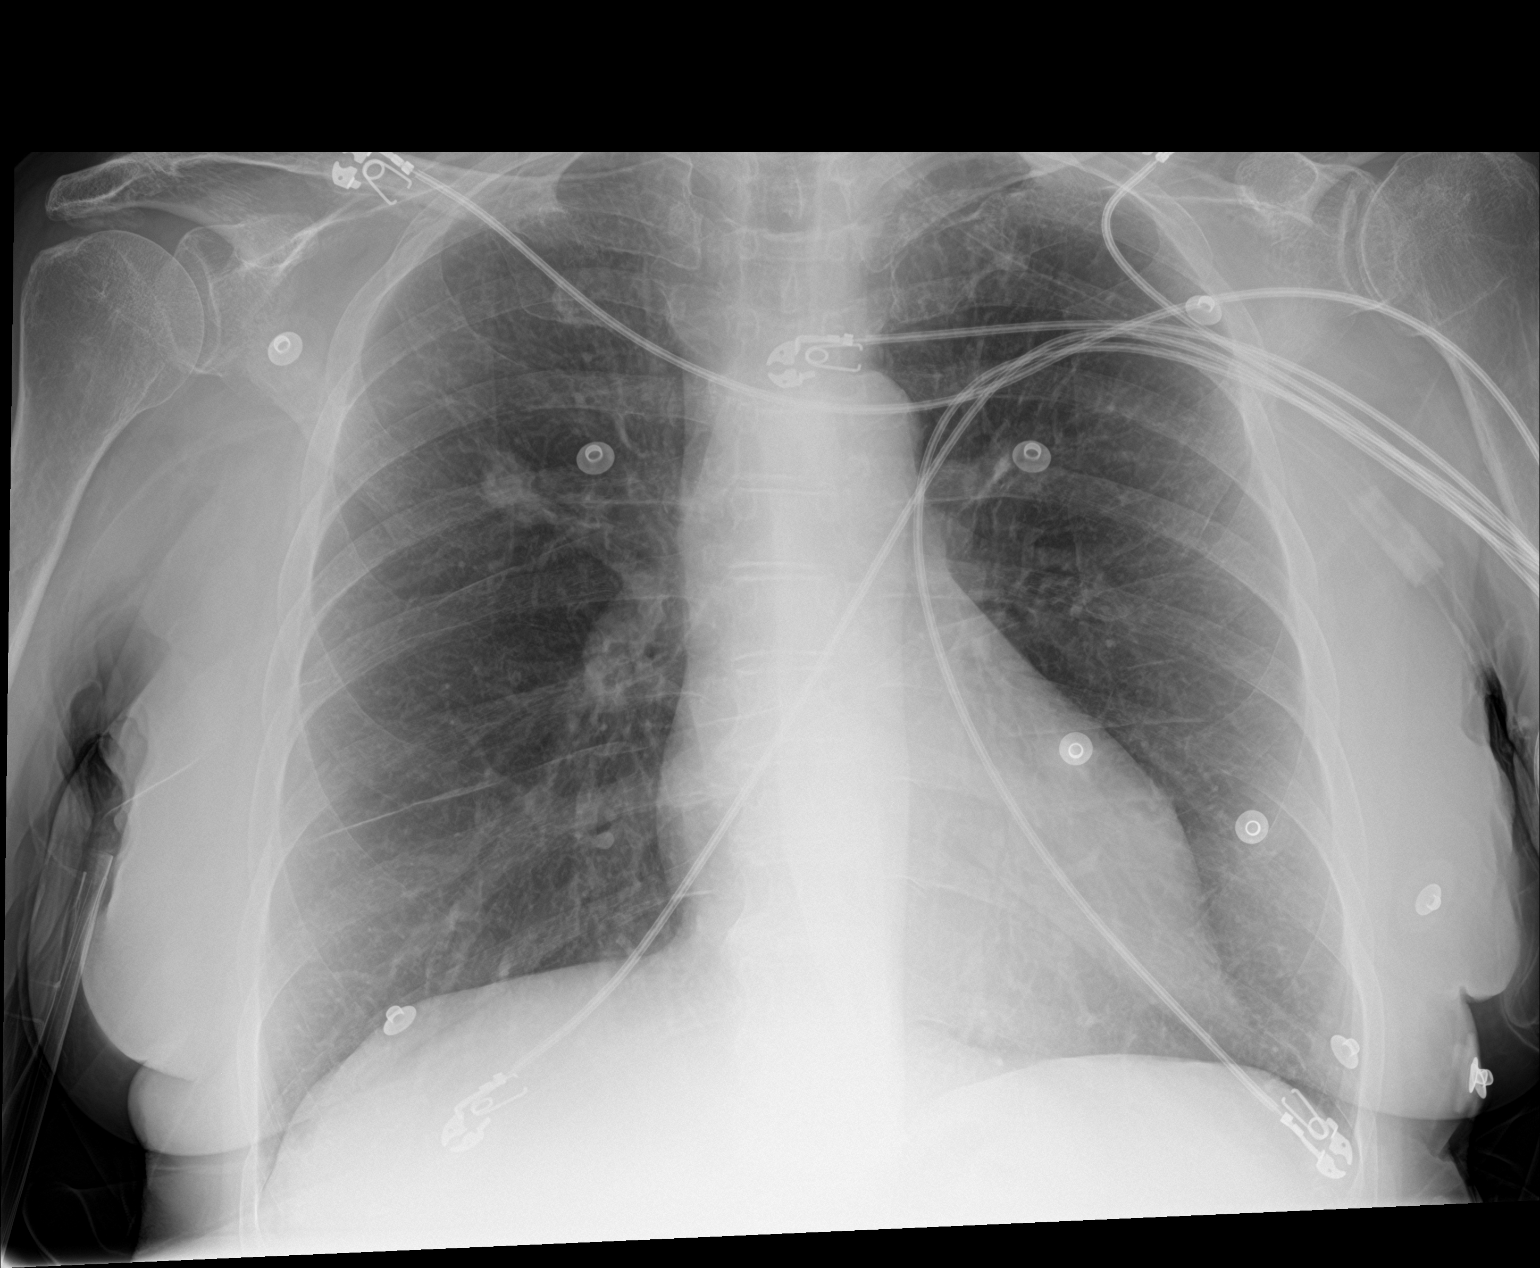

[1 of 1 positions shown; findings below may reference images not displayed]

FINDINGS: No acute consolidation or effusion. Normal cardiac size. No
pneumothorax. 17 mm irregular right upper lobe lung nodule.
Summation artifact versus tiny nodule left lung apex.
IMPRESSION: 1. No acute infiltrate or edema.
2. 17 mm irregular right upper lobe lung nodule. Tiny nodule versus
summation artifact at left apex. CT chest recommended for further
evaluation.

## 2022-11-18 ENCOUNTER — Other Ambulatory Visit (HOSPITAL_BASED_OUTPATIENT_CLINIC_OR_DEPARTMENT_OTHER): Payer: Self-pay

## 2022-11-18 MED ORDER — COMIRNATY 30 MCG/0.3ML IM SUSY
0.3000 mL | PREFILLED_SYRINGE | Freq: Once | INTRAMUSCULAR | 0 refills | Status: AC
  Filled 2022-11-18: qty 0.3, 1d supply, fill #0

## 2022-11-18 MED ORDER — FLUAD 0.5 ML IM SUSY
0.5000 mL | PREFILLED_SYRINGE | Freq: Once | INTRAMUSCULAR | 0 refills | Status: AC
  Filled 2022-11-18: qty 0.5, 1d supply, fill #0

## 2023-01-06 ENCOUNTER — Other Ambulatory Visit: Payer: Self-pay | Admitting: Cardiology

## 2023-01-06 DIAGNOSIS — I5181 Takotsubo syndrome: Secondary | ICD-10-CM

## 2023-06-18 ENCOUNTER — Other Ambulatory Visit: Payer: Self-pay | Admitting: Cardiology

## 2023-06-20 ENCOUNTER — Encounter (HOSPITAL_BASED_OUTPATIENT_CLINIC_OR_DEPARTMENT_OTHER): Payer: Self-pay

## 2023-06-20 ENCOUNTER — Telehealth: Payer: Self-pay | Admitting: Cardiology

## 2023-06-20 NOTE — Telephone Encounter (Signed)
 Pt c/o of Chest Pain: STAT if active CP, including tightness, pressure, jaw pain, radiating pain to shoulder/upper arm/back, CP unrelieved by Nitro. Symptoms reported of SOB, nausea, vomiting, sweating.  1. Are you having CP right now? "No"   2. Are you experiencing any other symptoms (ex. SOB, nausea, vomiting, sweating)? "none of the mentioned symptoms. I do have some edema, chills, and light-headedness on occasion"  3. Is your CP continuous or coming and going? "Palpitations/flutterings are not continuous. Become more noticable with stess, which most of us  are experiencing these days!"   4. Have you taken Nitroglycerin? "I do not have a prescription for nitroglycerin. I was given it only once by the rescue squad on the way to the hospital"  5. How long have you been experiencing CP? "It has been coming and going for the past few months."   6. If NO CP at time of call then end call with telling Pt to call back or call 911 if Chest pain returns prior to return call from triage team.    **Answers from pt messages**

## 2023-06-20 NOTE — Telephone Encounter (Signed)
 As greater than 1 year from office visit, would recommend evaluation in person.   To prevent palpitations: Make sure you are adequately hydrated.  Avoid and/or limit caffeine containing beverages like soda or tea. Exercise regularly.  Manage stress well. Some over the counter medications can cause palpitations such as Benadryl, AdvilPM, TylenolPM. Regular Advil or Tylenol do not cause palpitations.     Antonious Omahoney S Bobbe Quilter, NP

## 2023-06-20 NOTE — Telephone Encounter (Signed)
 Called and spoke to patient. Patient verified with 2 identifiers (DOB and Last Name)  Patient's concerns:  - pt has c/o palpitations, fluttering and lightheadedness on occasion.  - pt says palpitations do come more frequent when under stress.  - drinks one cup of coffee every day.  - tries to hydrate as much as she can.  - pt tries to not have a lot of salt in diet.  - states her BP has been good; unable to give readings.   Nurse's Recommendations:  - pt is due for a annual appt; will send to scheduling team to follow up.  - informed that caffeine and stress can cause palpitations.  - went over echo '24 and edema is not coming from heart per MD.  - will route to MD/APP for any further recommendations.  Patient verbalized understanding.

## 2023-06-22 ENCOUNTER — Other Ambulatory Visit: Payer: Self-pay | Admitting: Cardiology

## 2023-07-17 ENCOUNTER — Other Ambulatory Visit: Payer: Self-pay | Admitting: *Deleted

## 2023-07-17 MED ORDER — METOPROLOL SUCCINATE ER 50 MG PO TB24: 50.0000 mg | ORAL_TABLET | Freq: Every day | ORAL | 0 refills | Status: DC

## 2023-08-20 NOTE — Progress Notes (Unsigned)
 Cardiology Office Note   Date:  08/21/2023  ID:  Veronica Hodges, DOB 1950-10-19, MRN [id number] PCP: Veronica Hodges  [facility] Providers Cardiologist:  Veronica Donning, MD     PMH Takotsubo cardiomyopathy Hypertension GERD  Admission 02/2020 with diagnosis of NSTEMI.  She had moved to [place]  from [place]  with her husband.  She underwent cardiac catheterization which revealed LVEF 50%, anteroapical and inferior apical hypokinesis with basal hypokinesis, normal left main, normal left circumflex, normal RCA, and torturous LAD.  No obstructive disease was noted.  Mild troponin elevation felt to be stress-induced cardiomyopathy.  Conservative management recommended.  Echo 03/05/2020 showed EF 30 to 35%, G1 DD, and mild MR.  Her metoprolol  tartrate was switched to metoprolol  succinate.  She opted not to start statin therapy.  At follow-up appointment she was started on valsartan  40 mg twice daily.  She reported intermittent tension type headaches and episodes of migraine.  Follow-up echo 07/2020 revealed EF 30 to 35%, G1 DD, and mild MR.  Valsartan  was stopped and she was started on Entresto  49-51 mg twice daily.  She reported being very physically active walking 4 to 5 miles daily.  She denied increased shortness of breath with increased physical activity.  She reported home BP 120/70.  Follow-up echo 09/18/2020 revealed nearly normal LVEF 50 to 55%.  Last cardiology clinic visit was 05/16/2022 with Dr. Veryl Hodges.  She reported stress taking care of her husband who recently underwent right hip arthroplasty.  She reported leg swelling at the end of the day (R>L). Walking 1.5 hours for exercise. Sometimes feels a fluttery feeling in her chest. She had lightheadedness with Entresto  which was d/ced and Valsartan  40 mg daily was resumed. TTE 06/07/22 revealed norma LVEF 50-55%, G1DD, elevated LVEDP, no significant changes from previous echo.   History of Present Illness Discussed  the use of AI scribe software for clinical note transcription with the patient, who gave verbal consent to proceed.  History of Present Illness Veronica Hodges is a very pleasant 73 year old female who is here today for follow-up of HFrEF. She reports occasional palpitations and mild increase in blood pressure recently. She describes palpitations as 'little flutters' in her chest, lasting for a few seconds and occurring sporadically. There is no associated chest discomfort, shortness of breath, or syncope. Dizziness occurs occasionally, previously more pronounced on Entresto , but improved with valsartan . Home blood pressure readings have been higher, occasionally above 130 mmHg. She notices ankle swelling, particularly at the end of the day, with slight weight gain. There is no orthopnea or paroxysmal nocturnal dyspnea. She experiences leg and foot spasms and takes magnesium supplements. Her diet includes fish and chicken, following a Mediterranean diet, with occasional indiscretion with sodium. She remains active at home frequently climbing 3 sets of stairs in her home. No increased SOB or chest pain.   ROS: See HPI  Studies Reviewed EKG Interpretation Date/Time:  Monday August 21 2023 15:17:26 EDT Ventricular Rate:  60 PR Interval:  168 QRS Duration:  96 QT Interval:  440 QTC Calculation: 440 R Axis:   33  Text Interpretation: Normal sinus rhythm Low voltage QRS When compared with ECG of 04-Mar-2020 14:58, Right bundle branch block is no longer Present Criteria for Lateral infarct are no longer Present Confirmed by Veronica Hodges [id number]) on 08/21/2023 3:22:35 PM     No results found for: LIPOA  Risk Assessment/Calculations   HYPERTENSION CONTROL Vitals:   08/21/23 1511 08/21/23 1753  BP: (!) 140/70 Veronica Hodges)  140/60    The patient's blood pressure is elevated above target today.  In order to address the patient's elevated BP: Blood pressure will be monitored at home to determine if  medication changes need to be made.          Physical Exam VS:  BP (!) 140/60   Pulse 74   Ht 5' 5 (1.651 m)   Wt 143 lb (64.9 kg)   BMI 23.80 kg/m    Wt Readings from Last 3 Encounters:  08/21/23 143 lb (64.9 kg)  05/16/22 138 lb (62.6 kg)  05/17/21 127 lb 6.4 oz (57.8 kg)    GEN: Well nourished, well developed in no acute distress NECK: No JVD; No carotid bruits CARDIAC: RRR, no murmurs, rubs, gallops RESPIRATORY:  Clear to auscultation without rales, wheezing or rhonchi  ABDOMEN: Soft, non-tender, non-distended EXTREMITIES:  No edema; No deformity   Assessment & Plan Takotsubo Cardiomyopathy History of stress-induced cardiomyopathy in 2021 with LVEF 30 to 35%.  Normalization of LV EF to 50 to 55% on echo 09/2020 following initiation of Entresto .  Unfortunately, she did not tolerate Entresto  due to hypotension. Doing well on GDMT including metoprolol  and valsartan . BP mildly elevated as noted below, consider higher dose of valsartan  if BP remains elevated. She remains active and denies dyspnea, orthopnea, or PND. Mild LE edema at the end of the day. Weight is stable.  She is following a low-sodium diet.  Recommendation to continue current medications, low-sodium diet, regular physical activity, and fluid restriction. No indication for further testing at this time.   Hypertension   BP is mildly elevated in clinic today. She admits suboptimal readings recently. Encouraged her to monitor home BP for goal < 130/80. She previously had hypotension on Entresto  but previously tolerated valsartan  80 mg daily.  Monitor blood pressure two hours post-valsartan  in the AM.  She takes bisoprolol at night.  If valsartan  is increased, she understands we will need follow-up BMET.  Palpitations Intermittent fluttering likely due to premature atrial or ventricular contractions. Echocardiogram 06/2022 did not reveal any concerning valvular abnormalities.  She is not experiencing any significant  symptoms. Monitor symptoms and consider monitoring if frequency increases.  We will check complete metabolic panel to ensure no electrolyte abnormality that is contributing.  Continue metoprolol .  Hyperlipidemia  No evidence of CAD on cath in 2021.  Lipid panel 05/16/2022 with LDL 117.  Encouraged LDL goal less than 100.  We will recheck lipids when she can return fasting. Focus on heart healthy mostly plant based diet avoiding saturated fat, processed foods, simple carbohydrates, and sugar along with aiming for at least 150 minutes of moderate intensity exercise each week.   Leg spasms   She experiences leg and foot spasms frequently. She takes magnesium supplement. Will get BMET today to assess potassium and sodium levels.        Dispo: 1 year with Dr. Veryl Hodges or APP  Signed, Veronica Duncan, NP-C

## 2023-08-21 ENCOUNTER — Other Ambulatory Visit (HOSPITAL_BASED_OUTPATIENT_CLINIC_OR_DEPARTMENT_OTHER): Payer: Self-pay | Admitting: *Deleted

## 2023-08-21 ENCOUNTER — Ambulatory Visit (HOSPITAL_BASED_OUTPATIENT_CLINIC_OR_DEPARTMENT_OTHER): Admitting: Nurse Practitioner

## 2023-08-21 ENCOUNTER — Encounter (HOSPITAL_BASED_OUTPATIENT_CLINIC_OR_DEPARTMENT_OTHER): Payer: Self-pay | Admitting: Nurse Practitioner

## 2023-08-21 VITALS — BP 140/60 | HR 74 | Ht 65.0 in | Wt 143.0 lb

## 2023-08-21 DIAGNOSIS — M62838 Other muscle spasm: Secondary | ICD-10-CM

## 2023-08-21 DIAGNOSIS — R002 Palpitations: Secondary | ICD-10-CM

## 2023-08-21 DIAGNOSIS — I10 Essential (primary) hypertension: Secondary | ICD-10-CM | POA: Diagnosis not present

## 2023-08-21 DIAGNOSIS — I5181 Takotsubo syndrome: Secondary | ICD-10-CM

## 2023-08-21 DIAGNOSIS — E785 Hyperlipidemia, unspecified: Secondary | ICD-10-CM

## 2023-08-21 DIAGNOSIS — I5042 Chronic combined systolic (congestive) and diastolic (congestive) heart failure: Secondary | ICD-10-CM

## 2023-08-21 NOTE — Patient Instructions (Signed)
 Medication Instructions:   Your physician recommends that you continue on your current medications as directed. Please refer to the Current Medication list given to you today.    *If you need a refill on your cardiac medications before your next appointment, please call your pharmacy*  Lab Work:  Your physician recommends that you return for a FASTING lipid profile/cmet, fasting after midnight. Next week sometime. Paperwork given to patient today.    If you have labs (blood work) drawn today and your tests are completely normal, you will receive your results only by: MyChart Message (if you have MyChart) OR A paper copy in the mail If you have any lab test that is abnormal or we need to change your treatment, we will call you to review the results.  Testing/Procedures:  None ordered.  Follow-Up: At [facility], you and your health needs are our priority.  As part of our continuing mission to provide you with exceptional heart care, our providers are all part of one team.  This team includes your primary Cardiologist (physician) and Advanced Practice Providers or APPs (Physician Assistants and Nurse Practitioners) who all work together to provide you with the care you need, when you need it.  Your next appointment:   1 year(s)  Provider:   Sheryle Donning, MD    We recommend signing up for the patient portal called MyChart.  Sign up information is provided on this After Visit Summary.  MyChart is used to connect with patients for Virtual Visits (Telemedicine).  Patients are able to view lab/test results, encounter notes, upcoming appointments, etc.  Non-urgent messages can be sent to your provider as well.   To learn more about what you can do with MyChart, go to [email or web address].   Other Instructions  Your physician wants you to follow-up in: 1 year.  You will receive a reminder letter in the mail two months in advance. If you don't receive a letter,  please call our office to schedule the follow-up appointment.  HOW TO TAKE YOUR BLOOD PRESSURE  Rest 5 minutes before taking your blood pressure. Don't  smoke or drink caffeinated beverages for at least 30 minutes before. Take your blood pressure before (not after) you eat. Sit comfortably with your back supported and both feet on the floor ( don't cross your legs). Elevate your arm to heart level on a table or a desk. Use the proper sized cuff.  It should fit smoothly and snugly around your bare upper arm.  There should be  Enough room to slip a fingertip under the cuff.  The bottom edge of the cuff should be 1 inch above the crease Of the elbow. Please monitor your blood pressure once daily 2 hours after your am medication. Your goal for you BP is 130 (systolic) top number or over 80 ( diastolic) bottom number or below.   ----Avoid cold medicines with D or DM at the end of them----

## 2023-08-31 ENCOUNTER — Ambulatory Visit: Payer: Self-pay | Admitting: Nurse Practitioner

## 2023-08-31 LAB — LIPID PANEL
Chol/HDL Ratio: 3.5 ratio (ref 0.0–4.4)
Cholesterol, Total: 195 mg/dL (ref 100–199)
HDL: 56 mg/dL (ref >39)
LDL Chol Calc (NIH): 118 mg/dL — ABNORMAL HIGH (ref 0–99)
Triglycerides: 120 mg/dL (ref 0–149)
VLDL Cholesterol Cal: 21 mg/dL (ref 5–40)

## 2023-08-31 LAB — COMPREHENSIVE METABOLIC PANEL WITH GFR
ALT: 15 IU/L (ref 0–32)
AST: 22 IU/L (ref 0–40)
Albumin: 4.2 g/dL (ref 3.8–4.8)
Alkaline Phosphatase: 64 IU/L (ref 44–121)
BUN/Creatinine Ratio: 21 (ref 12–28)
BUN: 21 mg/dL (ref 8–27)
Bilirubin Total: 0.5 mg/dL (ref 0.0–1.2)
CO2: 23 mmol/L (ref 20–29)
Calcium: 9.7 mg/dL (ref 8.7–10.3)
Chloride: 101 mmol/L (ref 96–106)
Creatinine, Ser: 1.01 mg/dL — ABNORMAL HIGH (ref 0.57–1.00)
Globulin, Total: 2.3 g/dL (ref 1.5–4.5)
Glucose: 94 mg/dL (ref 70–99)
Potassium: 4.8 mmol/L (ref 3.5–5.2)
Sodium: 139 mmol/L (ref 134–144)
Total Protein: 6.5 g/dL (ref 6.0–8.5)
eGFR: 58 mL/min/{1.73_m2} — ABNORMAL LOW (ref >59)

## 2023-08-31 NOTE — Telephone Encounter (Signed)
 Follow up question

## 2023-09-05 MED ORDER — ROSUVASTATIN CALCIUM 5 MG PO TABS: 5.0000 mg | ORAL_TABLET | Freq: Every day | ORAL | 3 refills | Status: AC

## 2023-10-12 ENCOUNTER — Other Ambulatory Visit: Payer: Self-pay | Admitting: Cardiology

## 2023-10-14 ENCOUNTER — Other Ambulatory Visit: Payer: Self-pay | Admitting: Cardiology

## 2023-10-14 DIAGNOSIS — I5181 Takotsubo syndrome: Secondary | ICD-10-CM

## 2023-11-28 ENCOUNTER — Other Ambulatory Visit (HOSPITAL_BASED_OUTPATIENT_CLINIC_OR_DEPARTMENT_OTHER): Payer: Self-pay

## 2023-11-28 MED ORDER — FLUZONE HIGH-DOSE 0.5 ML IM SUSY
0.5000 mL | PREFILLED_SYRINGE | Freq: Once | INTRAMUSCULAR | 0 refills | Status: AC
  Filled 2023-11-28: qty 0.5, 1d supply, fill #0

## 2023-11-28 MED ORDER — COMIRNATY 30 MCG/0.3ML IM SUSY
0.3000 mL | PREFILLED_SYRINGE | Freq: Once | INTRAMUSCULAR | 0 refills | Status: AC
  Filled 2023-11-28: qty 0.3, 1d supply, fill #0
